# Patient Record
Sex: Male | Born: 1995 | Race: Black or African American | Hispanic: No | Marital: Single | Smoking: Never smoker
Health system: Southern US, Community
[De-identification: ages and names within clinical notes are randomized; demographics above are authoritative.]

## PROBLEM LIST (undated history)

## (undated) DIAGNOSIS — R109 Unspecified abdominal pain: Secondary | ICD-10-CM

## (undated) HISTORY — PX: KNEE SURGERY: SHX244

## (undated) HISTORY — DX: Unspecified abdominal pain: R10.9

---
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
## 2019-05-16 ENCOUNTER — Other Ambulatory Visit: Payer: Self-pay | Admitting: Sports Medicine

## 2019-05-16 DIAGNOSIS — M25552 Pain in left hip: Secondary | ICD-10-CM

## 2019-06-02 ENCOUNTER — Other Ambulatory Visit: Payer: Self-pay

## 2019-06-05 ENCOUNTER — Other Ambulatory Visit: Payer: Self-pay

## 2019-06-05 ENCOUNTER — Ambulatory Visit
Admission: RE | Admit: 2019-06-05 | Discharge: 2019-06-05 | Disposition: A | Discharge status: Home / Self Care | Payer: Managed Care, Other (non HMO) | Source: Ambulatory Visit | Attending: Sports Medicine | Admitting: Sports Medicine

## 2019-06-05 DIAGNOSIS — M25552 Pain in left hip: Secondary | ICD-10-CM

## 2019-09-21 ENCOUNTER — Emergency Department (HOSPITAL_COMMUNITY)
Admission: EM | Admit: 2019-09-21 | Discharge: 2019-09-22 | Disposition: A | Discharge status: Home / Self Care | Payer: Managed Care, Other (non HMO) | Attending: Emergency Medicine | Admitting: Emergency Medicine

## 2019-09-21 ENCOUNTER — Encounter (HOSPITAL_COMMUNITY): Payer: Self-pay | Admitting: Emergency Medicine

## 2019-09-21 ENCOUNTER — Other Ambulatory Visit: Payer: Self-pay

## 2019-09-21 DIAGNOSIS — Z5321 Procedure and treatment not carried out due to patient leaving prior to being seen by health care provider: Secondary | ICD-10-CM | POA: Insufficient documentation

## 2019-09-21 DIAGNOSIS — M25552 Pain in left hip: Secondary | ICD-10-CM | POA: Diagnosis present

## 2019-09-21 NOTE — ED Triage Notes (Signed)
Pt reports he has a left labrum tear that he has had a recent steroid injection for. C/o increased pain in the left hip after playing soccer tonight. Pt ambulatory at this time.

## 2019-09-22 NOTE — ED Notes (Signed)
Pt came to this tech and stated he was leaving and was going to go to urgent care in the morning. Pt was encouraged to stay, pt left.

## 2020-11-14 ENCOUNTER — Emergency Department (HOSPITAL_COMMUNITY)
Admission: EM | Admit: 2020-11-14 | Discharge: 2020-11-15 | Disposition: A | Discharge status: Home / Self Care | Payer: Managed Care, Other (non HMO) | Attending: Emergency Medicine | Admitting: Emergency Medicine

## 2020-11-14 ENCOUNTER — Other Ambulatory Visit: Payer: Self-pay

## 2020-11-14 DIAGNOSIS — L731 Pseudofolliculitis barbae: Secondary | ICD-10-CM | POA: Insufficient documentation

## 2020-11-14 DIAGNOSIS — L738 Other specified follicular disorders: Secondary | ICD-10-CM

## 2020-11-14 NOTE — ED Triage Notes (Signed)
Pt endorses rash to head over left ear that has spread he states since yesterday.  Pt in NAD at time of triage.

## 2020-11-14 NOTE — ED Provider Notes (Signed)
Emergency Medicine Provider Triage Evaluation Note  Bryan Hammond , a 25 y.o. male  was evaluated in triage.  Pt complains of a spreading pruritic rash. First noted on right wrist, then eyelid and then across left hairline behind ear.  Review of Systems  Positive: Rash Negative: Fever, chills  Physical Exam  BP (!) 142/94 (BP Location: Right Arm)   Pulse 74   Temp 98.3 F (36.8 C) (Oral)   Resp 14   Ht 6\' 1"  (1.854 m)   Wt 80.7 kg   SpO2 100%   BMI 23.48 kg/m  Gen:   Awake, no distress   Resp:  Normal effort  MSK:   Moves extremities without difficulty  Other:  Multiple small papules across left hairline  Medical Decision Making  Medically screening exam initiated at 10:40 PM.  Appropriate orders placed.  Jesiel Garate was informed that the remainder of the evaluation will be completed by another provider, this initial triage assessment does not replace that evaluation, and the importance of remaining in the ED until their evaluation is complete.    Lonia Skinner 11/14/20 2240    01/14/21, MD 11/14/20 2250

## 2020-11-15 MED ORDER — DOXYCYCLINE HYCLATE 100 MG PO CAPS: 100.0000 mg | ORAL_CAPSULE | Freq: Two times a day (BID) | ORAL | 0 refills | Status: AC

## 2020-11-15 NOTE — Discharge Instructions (Addendum)
You were seen today for rash.  This is consistent with folliculitis.  Given that it is in your scalp and your beard, you will be placed on oral antibiotics.  Take as directed.  Avoid directly shaving over the area.

## 2020-11-15 NOTE — ED Provider Notes (Signed)
[facility] EMERGENCY DEPARTMENT Provider Note   CSN: [id number] Arrival date & time: 11/14/20  2213     History Chief Complaint  Patient presents with   Rash    Bryan Hammond is a 25 y.o. male.  HPI     This is a 25 year old male who presents with rash.  Patient reports several day history of worsening rash.  It is mostly over his face and his scalp.  Initially it was itchy and now it is becoming more painful.  He has not noted any purulence.  He last had a haircut 3 weeks ago.  He has not been in any hot tubs.  Denies any new soaps, detergents, or shampoos.  He has not had any fevers or systemic symptoms.  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     Family History  Problem Relation Age of Onset   Spina bifida Brother    Ulcers Neg Hx    Cholelithiasis Neg Hx     Social History   Tobacco Use   Smoking status: Never   Smokeless tobacco: Never  Substance Use Topics   Alcohol use: No   Drug use: No    Home Medications Prior to Admission medications   Medication Sig Start Date End Date Taking? Authorizing Provider  doxycycline (VIBRAMYCIN) 100 MG capsule Take 1 capsule (100 mg total) by mouth 2 (two) times daily. 11/15/20  Yes Keani Gotcher, Mayer Masker, MD  cephALEXin (KEFLEX) 500 MG capsule Take 1 capsule (500 mg total) by mouth 2 (two) times daily. 12/24/14   Barrett Henle, PA-C  omeprazole (PRILOSEC) 20 MG capsule Take 1 capsule (20 mg total) by mouth daily. 01/26/13 07/27/13  Jon Gills, MD    Allergies    Lactose intolerance (gi)  Review of Systems   Review of Systems  Constitutional:  Negative for fever.  Skin:  Positive for rash.  All other systems reviewed and are negative.  Physical Exam Updated Vital Signs BP 140/88 (BP  Location: Right Arm)   Pulse 69   Temp 98 F (36.7 C)   Resp 18   Ht 1.854 m (6\' 1" )   Wt 80.7 kg   SpO2 100%   BMI 23.48 kg/m   Physical Exam Vitals and nursing note reviewed.  Constitutional:      Appearance: He is well-developed. He is not ill-appearing.  HENT:     Head: Normocephalic and atraumatic.     Nose: Nose normal.     Mouth/Throat:     Mouth: Mucous membranes are moist.  Eyes:     Pupils: Pupils are equal, round, and reactive to light.  Cardiovascular:     Rate and Rhythm: Normal rate and regular rhythm.  Pulmonary:     Effort: Pulmonary effort is normal. No respiratory distress.  Abdominal:     Palpations: Abdomen is soft.     Tenderness: There is no abdominal tenderness.  Musculoskeletal:     Cervical back: Neck supple.     Right lower leg: No edema.     Left lower leg: No edema.  Lymphadenopathy:     Cervical: No cervical adenopathy.  Skin:    General: Skin is warm and dry.     Comments: Multiple pustules noted over  the left side of patient's face within the hair follicles of his beard, through the hairline above the ear and behind the neck, no fluctuance noted, no adjacent erythema  Neurological:     Mental Status: He is alert and oriented to person, place, and time.  Psychiatric:        Mood and Affect: Mood normal.    ED Results / Procedures / Treatments   Labs (all labs ordered are listed, but only abnormal results are displayed) Labs Reviewed - No data to display  EKG None  Radiology No results found.  Procedures Procedures   Medications Ordered in ED Medications - No data to display  ED Course  I have reviewed the triage vital signs and the nursing notes.  Pertinent labs & imaging results that were available during my care of the patient were reviewed by me and considered in my medical decision making (see chart for details).    MDM Rules/Calculators/A&P                           Patient presents with a rash.  Initially itchy  and now somewhat painful.  He is overall nontoxic and vital signs are reassuring.  He has no systemic symptoms.  He has multiple pustules mostly over the hairline of the face and the scalp.  Presentation most suspicious for folliculitis or folliculitis Barbae.  Less likely contact dermatitis.  He reports haircut 3 weeks ago but does shave as well.  Given location, will treat with oral antibiotics and cover for MRSA with doxycycline.  After history, exam, and medical workup I feel the patient has been appropriately medically screened and is safe for discharge home. Pertinent diagnoses were discussed with the patient. Patient was given return precautions.  Final Clinical Impression(s) / ED Diagnoses Final diagnoses:  Folliculitis barbae    Rx / DC Orders ED Discharge Orders          Ordered    doxycycline (VIBRAMYCIN) 100 MG capsule  2 times daily        11/15/20 0408             Jordanny Waddington, Mayer Masker, MD 11/15/20 [phone]

## 2021-05-11 NOTE — Progress Notes (Deleted)
Synopsis: Referred for dyspnea, CP by self  Subjective:   PATIENT ID: Bryan Hammond GENDER: male DOB: [birth year], MRN: [id number]  No chief complaint on file.     ***  Otherwise pertinent review of systems is negative.  Past Medical History:  Diagnosis Date   Abdominal pain      Family History  Problem Relation Age of Onset   Spina bifida Brother    Ulcers Neg Hx    Cholelithiasis Neg Hx      No past surgical history on file.  Social History   Socioeconomic History   Marital status: Single    Spouse name: Not on file   Number of children: Not on file   Years of education: Not on file   Highest education level: Not on file  Occupational History   Not on file  Tobacco Use   Smoking status: Never   Smokeless tobacco: Never  Substance and Sexual Activity   Alcohol use: No   Drug use: No   Sexual activity: Not on file  Other Topics Concern   Not on file  Social History Narrative   12th grade in Fall 2014   Social Determinants of Health   Financial Resource Strain: Not on file  Food Insecurity: Not on file  Transportation Needs: Not on file  Physical Activity: Not on file  Stress: Not on file  Social Connections: Not on file  Intimate Partner Violence: Not on file     Allergies  Allergen Reactions   Lactose Intolerance (Gi)      Outpatient Medications Prior to Visit  Medication Sig Dispense Refill   cephALEXin (KEFLEX) 500 MG capsule Take 1 capsule (500 mg total) by mouth 2 (two) times daily. 14 capsule 0   doxycycline (VIBRAMYCIN) 100 MG capsule Take 1 capsule (100 mg total) by mouth 2 (two) times daily. 28 capsule 0   omeprazole (PRILOSEC) 20 MG capsule Take 1 capsule (20 mg total) by mouth daily. 30 capsule 6   No facility-administered medications prior to visit.       Objective:   Physical Exam:  General appearance: 26 y.o., male, NAD, conversant  Eyes: anicteric sclerae; PERRL, tracking appropriately HENT: NCAT; MMM Neck: Trachea  midline; no lymphadenopathy, no JVD Lungs: CTAB, no crackles, no wheeze, with normal respiratory effort CV: RRR, no murmur  Abdomen: Soft, non-tender; non-distended, BS present  Extremities: No peripheral edema, warm Skin: Normal turgor and texture; no rash Psych: Appropriate affect Neuro: Alert and oriented to person and place, no focal deficit     There were no vitals filed for this visit.   on *** LPM *** RA BMI Readings from Last 3 Encounters:  11/14/20 23.48 kg/m  12/24/14 20.67 kg/m (21 %, Z= -0.81)*  01/26/13 19.25 kg/m (16 %, Z= -1.00)*   * Growth percentiles are based on CDC (Boys, 2-20 Years) data.   Wt Readings from Last 3 Encounters:  11/14/20 178 lb (80.7 kg)  12/24/14 140 lb (63.5 kg) (26 %, Z= -0.64)*  01/26/13 140 lb (63.5 kg) (39 %, Z= -0.29)*   * Growth percentiles are based on CDC (Boys, 2-20 Years) data.     CBC    Component Value Date/Time   WBC 9.5 12/24/2014 1129   RBC 4.92 12/24/2014 1129   HGB 12.2 (L) 12/24/2014 1129   HCT 38.2 (L) 12/24/2014 1129   PLT 242 12/24/2014 1129   MCV 77.6 (L) 12/24/2014 1129   MCH 24.8 (L) 12/24/2014 1129   MCHC 31.9  12/24/2014 1129   RDW 14.7 12/24/2014 1129    ***  Chest Imaging: ***  Pulmonary Functions Testing Results: No flowsheet data found.  FeNO: ***  Pathology: ***  Echocardiogram: ***  Heart Catheterization: ***    Assessment & Plan:    Plan:      Omar Person, MD [facility] 05/11/2021 4:59 PM

## 2021-05-12 ENCOUNTER — Institutional Professional Consult (permissible substitution): Payer: Self-pay | Admitting: Student

## 2021-05-22 NOTE — Progress Notes (Unsigned)
Synopsis: Referred for shortness of breath, CP by No ref. provider found  Subjective:   PATIENT ID: Bryan Hammond GENDER: male DOB: March 06, 1996, MRN: [id number]  No chief complaint on file.  26yM referred for shortness of breath, CP   Otherwise pertinent review of systems is negative.  Past Medical History:  Diagnosis Date   Abdominal pain      Family History  Problem Relation Age of Onset   Spina bifida Brother    Ulcers Neg Hx    Cholelithiasis Neg Hx      No past surgical history on file.  Social History   Socioeconomic History   Marital status: Single    Spouse name: Not on file   Number of children: Not on file   Years of education: Not on file   Highest education level: Not on file  Occupational History   Not on file  Tobacco Use   Smoking status: Never   Smokeless tobacco: Never  Substance and Sexual Activity   Alcohol use: No   Drug use: No   Sexual activity: Not on file  Other Topics Concern   Not on file  Social History Narrative   12th grade in Fall 2014   Social Determinants of Health   Financial Resource Strain: Not on file  Food Insecurity: Not on file  Transportation Needs: Not on file  Physical Activity: Not on file  Stress: Not on file  Social Connections: Not on file  Intimate Partner Violence: Not on file     Allergies  Allergen Reactions   Lactose Intolerance (Gi)      Outpatient Medications Prior to Visit  Medication Sig Dispense Refill   cephALEXin (KEFLEX) 500 MG capsule Take 1 capsule (500 mg total) by mouth 2 (two) times daily. 14 capsule 0   doxycycline (VIBRAMYCIN) 100 MG capsule Take 1 capsule (100 mg total) by mouth 2 (two) times daily. 28 capsule 0   omeprazole (PRILOSEC) 20 MG capsule Take 1 capsule (20 mg total) by mouth daily. 30 capsule 6   No facility-administered medications prior to visit.       Objective:   Physical Exam:  General appearance: 26 y.o., male, NAD, conversant  Eyes: anicteric  sclerae; PERRL, tracking appropriately HENT: NCAT; MMM Neck: Trachea midline; no lymphadenopathy, no JVD Lungs: CTAB, no crackles, no wheeze, with normal respiratory effort CV: RRR, no murmur  Abdomen: Soft, non-tender; non-distended, BS present  Extremities: No peripheral edema, warm Skin: Normal turgor and texture; no rash Psych: Appropriate affect Neuro: Alert and oriented to person and place, no focal deficit     There were no vitals filed for this visit.   on *** LPM *** RA BMI Readings from Last 3 Encounters:  11/14/20 23.48 kg/m  12/24/14 20.67 kg/m (21 %, Z= -0.81)*  01/26/13 19.25 kg/m (16 %, Z= -1.00)*   * Growth percentiles are based on CDC (Boys, 2-20 Years) data.   Wt Readings from Last 3 Encounters:  11/14/20 178 lb (80.7 kg)  12/24/14 140 lb (63.5 kg) (26 %, Z= -0.64)*  01/26/13 140 lb (63.5 kg) (39 %, Z= -0.29)*   * Growth percentiles are based on CDC (Boys, 2-20 Years) data.     CBC    Component Value Date/Time   WBC 9.5 12/24/2014 1129   RBC 4.92 12/24/2014 1129   HGB 12.2 (L) 12/24/2014 1129   HCT 38.2 (L) 12/24/2014 1129   PLT 242 12/24/2014 1129   MCV 77.6 (L) 12/24/2014 1129  MCH 24.8 (L) 12/24/2014 1129   MCHC 31.9 12/24/2014 1129   RDW 14.7 12/24/2014 1129    ***  Chest Imaging: ***  Pulmonary Functions Testing Results: No flowsheet data found.  FeNO: ***  Pathology: ***  Echocardiogram: ***  Heart Catheterization: ***    Assessment & Plan:    Plan:      Omar Person, MD [facility] 05/22/2021 2:01 PM

## 2021-05-23 ENCOUNTER — Institutional Professional Consult (permissible substitution): Payer: Self-pay | Admitting: Student

## 2021-09-06 ENCOUNTER — Emergency Department (HOSPITAL_COMMUNITY): Payer: Self-pay

## 2021-09-06 ENCOUNTER — Emergency Department (HOSPITAL_COMMUNITY)
Admission: EM | Admit: 2021-09-06 | Discharge: 2021-09-07 | Disposition: A | Discharge status: Home / Self Care | Payer: Self-pay | Attending: Student | Admitting: Student

## 2021-09-06 ENCOUNTER — Other Ambulatory Visit: Payer: Self-pay

## 2021-09-06 DIAGNOSIS — M25561 Pain in right knee: Secondary | ICD-10-CM | POA: Diagnosis not present

## 2021-09-06 MED ORDER — IBUPROFEN 800 MG PO TABS: 800.0000 mg | ORAL_TABLET | Freq: Once | ORAL | Status: DC

## 2021-09-06 NOTE — ED Provider Triage Note (Signed)
  Emergency Medicine Provider Triage Evaluation Note  MRN:  [id number]  Arrival date & time: 09/06/21    Medically screening exam initiated at 11:03 PM.   CC:   Knee pain  HPI:  Bryan Hammond is a 26 y.o. year-old male presents to the ED with chief complaint of right knee pain.  States that it started during a soccer game.  States that he felt 2 pops during the game and now he can't bend it.  States that it felt like it was going to give out.  History provided by History provided by patient. ROS:  -As included in HPI PE:   Vitals:   09/06/21 2209  BP: 128/85  Pulse: 70  Resp: 16  Temp: 98 F (36.7 C)  SpO2: 100%    Non-toxic appearing No respiratory distress Right knee held in extension, won't allow me to flex it, no significant swelling MDM:  Based on signs and symptoms, sprain is highest on my differential, followed by fx. I've ordered imaging in triage to expedite lab/diagnostic workup.  Patient was informed that the remainder of the evaluation will be completed by another provider, this initial triage assessment does not replace that evaluation, and the importance of remaining in the ED until their evaluation is complete.    Roxy Horseman, PA-C 09/06/21 2305

## 2021-09-06 NOTE — ED Triage Notes (Signed)
Pt here for R knee pain. Pt was playing soccer tonight and twisted, heard "2 loud pops", pt has pain when bending the knee. Pt is able to bear weight.

## 2021-09-07 MED ORDER — IBUPROFEN 600 MG PO TABS: 600.0000 mg | ORAL_TABLET | Freq: Four times a day (QID) | ORAL | 0 refills | Status: AC | PRN

## 2021-09-07 NOTE — Progress Notes (Signed)
Orthopedic Tech Progress Note Patient Details:  Bryan Hammond 08/09/1995 [id number]  Ortho Devices Type of Ortho Device: Knee Immobilizer, Crutches Ortho Device/Splint Location: RLE Ortho Device/Splint Interventions: Ordered, Application, Adjustment   Post Interventions Patient Tolerated: Well Instructions Provided: Adjustment of device, Care of device  [place] A [name] 09/07/2021, 3:46 AM

## 2021-09-07 NOTE — ED Provider Notes (Signed)
[facility]EMERGENCY DEPT [facility] Emergency Department Provider Note MRN:  [id number]  Arrival date & time: 09/07/21     Chief Complaint   Knee Pain   History of Present Illness   Bryan Hammond is a 26 y.o. year-old male presents to the ED with chief complaint of right knee pain.  States that it started during a soccer game.  States that he felt 2 pops during the game and now he can't bend it.  States that it felt like it was going to give out.Marland Kitchen  History provided by patient.   Review of Systems  Pertinent review of systems noted in HPI.    Physical Exam   Vitals:   09/06/21 2209 09/07/21 0313  BP: 128/85 (!) 145/81  Pulse: 70 65  Resp: 16 18  Temp: 98 F (36.7 C) 98.2 F (36.8 C)  SpO2: 100% 100%    CONSTITUTIONAL:  well-appearing, NAD NEURO:  Alert and oriented x 3, CN 3-12 grossly intact EYES:  eyes equal and reactive ENT/NECK:  Supple, no stridor  CARDIO:   appears well-perfused  PULM:  No respiratory distress,  GI/GU:  non-distended,  MSK/SPINE:  No gross deformities, no significant swelling, pain with flexion SKIN:  no rash, atraumatic   *Additional and/or pertinent findings included in MDM below  Diagnostic and Interventional Summary    EKG Interpretation  Date/Time:    Ventricular Rate:    PR Interval:    QRS Duration:   QT Interval:    QTC Calculation:   R Axis:     Text Interpretation:         Labs Reviewed - No data to display  DG Knee Complete 4 Views Right  Final Result      Medications  ibuprofen (ADVIL) tablet 800 mg (has no administration in time range)     Procedures  /  Critical Care Procedures  ED Course and Medical Decision Making  I have reviewed the triage vital signs, the nursing notes, and pertinent available records from the EMR.  Social Determinants Affecting Complexity of Care: Patient has no clinically significant social determinants affecting this chief complaint..   ED Course:   Patient here with right  knee pain.  Top differential diagnoses include fx, sprain, streain. Medical Decision Making Problems Addressed: Acute pain of right knee: acute illness or injury  Amount and/or Complexity of Data Reviewed Radiology: ordered and independent interpretation performed.    Details: no fx or dislocation  Risk Prescription drug management.     Consultants: No consultations were needed in caring for this patient.   Treatment and Plan: Emergency department workup does not suggest an emergent condition requiring admission or immediate intervention beyond  what has been performed at this time. The patient is safe for discharge and has  been instructed to return immediately for worsening symptoms, change in  symptoms or any other concerns    Final Clinical Impressions(s) / ED Diagnoses     ICD-10-CM   1. Acute pain of right knee  M25.561       ED Discharge Orders          Ordered    ibuprofen (ADVIL) 600 MG tablet  Every 6 hours PRN        09/07/21 0326              Discharge Instructions Discussed with and Provided to Patient:   Discharge Instructions   None      Roxy Horseman, PA-C 09/07/21 1856    [place],  Wyn Forster, MD 09/07/21 [phone]

## 2022-01-01 ENCOUNTER — Encounter: Payer: PRIVATE HEALTH INSURANCE | Attending: Specialist

## 2022-01-01 NOTE — Progress Notes (Deleted)
ASSESSMENT/PLAN:  Below is the assessment and plan developed based on review of pertinent history, physical exam, labs, studies, and medications.    {There are no diagnoses linked to this encounter. (Refresh or delete this SmartLink)}    No follow-ups on file.     In discussion with the patient, we considered the numerus possible diagnoses that could be contributing to their present symptoms. We also deliberated on the extensive management options that must be considered to treat their current condition. We reviewed their accessible prior medical records, diagnostic tests, and current health and employment information. We considered how these symptoms were affecting the patients activities of daily living as well as employment and fitness activities. The patient had various questions regarding the possible risks, benefits, complications, morbidity and mortality regarding their diagnosis and treatment options. The patients comorbidities were considered, and I advocated that they consider maximizing lifestyle modification through nutrition and exercise to aid in addressing their symptoms. Shared decision making yielded an understanding to move forward with conservation treatment preferences. The patient expressed understanding that if conservative management fails to alleviate the present symptoms they will return to office for re-evaluation and consideration of additional diagnostic tests and potential surgical options.     In the interim, we have recommended ice, elevation, and take prescription anti-inflammatory medications along with a physician directed home exercise program. We discussed the risks and common side effects of anti-inflammatory medications and instructed the patient to discontinue the medication and contact us if they experienced any side effects. The patient was encouraged to discuss the possible side effects with their family physician or pharmacist prior to initiating any new  medications.    We discussed the fact that many of the recommended treatment options presented are significantly limited by the patients social determinants of health. We also reviewed the circumstances surrounding the environment that they live and work which affect a wide range of health risk. We considered the limited access to appropriate educational resources regarding proper nutrition and exercise as well as the economic and social support necessary to maintain health and wellbeing.     ***    SUBJECTIVE/OBJECTIVE:  Dominic Arnold (DOB: 04/06/1996) is a 26 y.o. male, patient,here for evaluation of the No chief complaint on file.  .   ***     PHYSICAL EXAM:    ***    IMAGING:    I have independently reviewed and interpreted the following images:     ***    Not on File    No current outpatient medications on file.     No current facility-administered medications for this visit.       No past medical history on file.    No past surgical history on file.    No family history on file.    Social History     Socioeconomic History    Marital status: Married     Spouse name: Not on file    Number of children: Not on file    Years of education: Not on file    Highest education level: Not on file   Occupational History    Not on file   Tobacco Use    Smoking status: Not on file    Smokeless tobacco: Not on file   Substance and Sexual Activity    Alcohol use: Not on file    Drug use: Not on file    Sexual activity: Not on file   Other Topics Concern    Not on  file   Social History Narrative    Not on file     Social Determinants of Health     Financial Resource Strain: Not on file   Food Insecurity: Not on file   Transportation Needs: Not on file   Physical Activity: Not on file   Stress: Not on file   Social Connections: Not on file   Intimate Partner Violence: Not on file   Housing Stability: Not on file       Review of Systems    No flowsheet data found.    Vitals:  There were no vitals taken for this visit.   There is  no height or weight on file to calculate BMI.      An electronic signature was used to authenticate this note.  -- Cyndia Skeeters, MD

## 2022-01-03 ENCOUNTER — Ambulatory Visit: Admit: 2022-01-03 | Discharge: 2022-01-03 | Payer: PRIVATE HEALTH INSURANCE | Attending: Specialist

## 2022-01-03 ENCOUNTER — Ambulatory Visit: Admit: 2022-01-03 | Payer: PRIVATE HEALTH INSURANCE

## 2022-01-03 DIAGNOSIS — M25561 Pain in right knee: Secondary | ICD-10-CM

## 2022-01-03 NOTE — Progress Notes (Unsigned)
ASSESSMENT/PLAN:  Below is the assessment and plan developed based on review of pertinent history, physical exam, labs, studies, and medications.    {There are no diagnoses linked to this encounter. (Refresh or delete this SmartLink)}    No follow-ups on file.     In discussion with the patient, we considered the numerus possible diagnoses that could be contributing to their present symptoms. We also deliberated on the extensive management options that must be considered to treat their current condition. We reviewed their accessible prior medical records, diagnostic tests, and current health and employment information. We considered how these symptoms were affecting the patients activities of daily living as well as employment and fitness activities. The patient had various questions regarding the possible risks, benefits, complications, morbidity and mortality regarding their diagnosis and treatment options. The patients comorbidities were considered, and I advocated that they consider maximizing lifestyle modification through nutrition and exercise to aid in addressing their symptoms. Shared decision making yielded an understanding to move forward with conservation treatment preferences. The patient expressed understanding that if conservative management fails to alleviate the present symptoms they will return to office for re-evaluation and consideration of additional diagnostic tests and potential surgical options.     In the interim, we have recommended ice, elevation, and take prescription anti-inflammatory medications along with a physician directed home exercise program. We discussed the risks and common side effects of anti-inflammatory medications and instructed the patient to discontinue the medication and contact us if they experienced any side effects. The patient was encouraged to discuss the possible side effects with their family physician or pharmacist prior to initiating any new  medications.    We discussed the fact that many of the recommended treatment options presented are significantly limited by the patients social determinants of health. We also reviewed the circumstances surrounding the environment that they live and work which affect a wide range of health risk. We considered the limited access to appropriate educational resources regarding proper nutrition and exercise as well as the economic and social support necessary to maintain health and wellbeing.     ***    SUBJECTIVE/OBJECTIVE:  Dominic Arnold (DOB: 04/06/1996) is a 26 y.o. male, patient,here for evaluation of the No chief complaint on file.  .   ***     PHYSICAL EXAM:    ***    IMAGING:    I have independently reviewed and interpreted the following images:     ***    Not on File    No current outpatient medications on file.     No current facility-administered medications for this visit.       No past medical history on file.    No past surgical history on file.    No family history on file.    Social History     Socioeconomic History   . Marital status: Married     Spouse name: Not on file   . Number of children: Not on file   . Years of education: Not on file   . Highest education level: Not on file   Occupational History   . Not on file   Tobacco Use   . Smoking status: Not on file   . Smokeless tobacco: Not on file   Substance and Sexual Activity   . Alcohol use: Not on file   . Drug use: Not on file   . Sexual activity: Not on file   Other Topics Concern   . Not on  file   Social History Narrative   . Not on file     Social Determinants of Health     Financial Resource Strain: Not on file   Food Insecurity: Not on file   Transportation Needs: Not on file   Physical Activity: Not on file   Stress: Not on file   Social Connections: Not on file   Intimate Partner Violence: Not on file   Housing Stability: Not on file       Review of Systems    No flowsheet data found.    Vitals:  There were no vitals taken for this visit.    There is no height or weight on file to calculate BMI.      An electronic signature was used to authenticate this note.  -- Illene Bolus, MD

## 2022-01-31 NOTE — Telephone Encounter (Signed)
Patient phones office leaving a message in reference to him retrieving his previous MRI  for his knee. I phoned patient back and had to leave a message.

## 2022-02-21 ENCOUNTER — Ambulatory Visit: Admit: 2022-02-21 | Discharge: 2022-02-21 | Payer: PRIVATE HEALTH INSURANCE | Attending: Specialist

## 2022-02-21 DIAGNOSIS — S83281S Other tear of lateral meniscus, current injury, right knee, sequela: Secondary | ICD-10-CM

## 2022-02-21 NOTE — Progress Notes (Signed)
ASSESSMENT/PLAN:  Below is the assessment and plan developed based on review of pertinent history, physical exam, labs, studies, and medications.    1. Tear of lateral meniscus of right knee, unspecified tear type, unspecified whether old or current tear, sequela      Return for Patient will call to schedule surgery.     We discussed the treatment options for the patients diagnosis, which included: living with the extremity as it is, organized exercises, medicines, injections, and surgical options. Utilizing shared treatment decision-making; we also discussed the nature and purpose of the treatment options along with the expected risks and benefits. The patient has expressed a desire to proceed with surgery, and I think that is a reasonable option. I educated the patient regarding the inherent and unavoidable risks which include, but are not limited to anesthesia, infection, damage to nerves and blood vessels, blood loss, blood clots, and even death were discussed at length. We also talked about the possibility of not being able to return to prior activities or employment, the need for future surgery, and complex regional pain syndrome. The patient expressed understanding of these risks and has elected to proceed with surgery. Ample time was given for questions, of which many were addressed. We have discussed the surgical procedure as well as the realistic expectations regarding the risks, outcome and post-operative protocol. We will set this up when it is convenient for the patient. We have instructed them to contact us if there are any questions or concerns between now and their date of surgery.     We talked about the procedure as well as the postoperative protocol in detail.  He is can look at his counter and call us to schedule right knee arthroscopy with possible lateral meniscus repair versus meniscectomy.  He understands the difference in repair and meniscectomy regarding the postop protocol.  He  understands he would be more than likely on crutches for 6 weeks.  He will bring this the MRI disc for viewing prior to his date of surgery.    SUBJECTIVE/OBJECTIVE:  Dominic Arnold (DOB: 02/04/1996) is a 26 y.o. male, patient,here for evaluation of the Knee Pain (right)  .   Patient seen today for the right knee.  Unfortunately, he sustained a soccer injury a couple months ago and injured his lateral meniscus.  He had an MRI and was diagnosed with a radial tear of his lateral meniscus.  He was subsequently treated conservatively.  He follows up today.  He states that he has been doing well and is still playing soccer.  He complains that he is feeling locking and catching on the lateral aspect of his knee.  He states that he can still play soccer but his knee swells up afterwards.    PHYSICAL EXAM:    Right knee with portal sites well-healed.  He has full active and passive range of motion of the knee.  He has no tenderness palpation of the lateral or medial joint line.  He has a negative Murray's.  Negative Lachman's.  Stable to varus and valgus stress at 0 and 30 degrees.    IMAGING:    I have independently reviewed and interpreted the following images:     4 views of the right knee show no evidence of fracture or dislocation.  There is good joint space and alignment maintained.    Allergies   Allergen Reactions    Lactose Intolerance (Gi)        Current Outpatient Medications   Medication  Sig Dispense Refill    ondansetron (ZOFRAN-ODT) 4 MG disintegrating tablet Place 1 tablet every 6-8 hours by translingual route as needed.       No current facility-administered medications for this visit.       Past Medical History:   Diagnosis Date    Tear of MCL (medial collateral ligament) of knee        Past Surgical History:   Procedure Laterality Date    KNEE ARTHROSCOPY W/ MEDIAL COLLATERAL LIGAMENT (MCL) REPAIR         No family history on file.    Social History     Socioeconomic History    Marital status: Married      Spouse name: Not on file    Number of children: Not on file    Years of education: Not on file    Highest education level: Not on file   Occupational History    Not on file   Tobacco Use    Smoking status: Never    Smokeless tobacco: Never   Substance and Sexual Activity    Alcohol use: Never    Drug use: Never    Sexual activity: Not on file   Other Topics Concern    Not on file   Social History Narrative    Not on file     Social Determinants of Health     Financial Resource Strain: Not on file   Food Insecurity: Not on file   Transportation Needs: Not on file   Physical Activity: Not on file   Stress: Not on file   Social Connections: Not on file   Intimate Partner Violence: Not on file   Housing Stability: Not on file       Review of Systems    No flowsheet data found.    Vitals:  Ht 1.854 m (6\' 1" )   Wt 80.7 kg (178 lb)   BMI 23.48 kg/m    Body mass index is 23.48 kg/m.      An electronic signature was used to authenticate this note.  -- Cyndia Skeeters, MD

## 2022-03-13 ENCOUNTER — Encounter: Payer: PRIVATE HEALTH INSURANCE | Attending: Specialist

## 2022-03-13 DIAGNOSIS — S83281S Other tear of lateral meniscus, current injury, right knee, sequela: Secondary | ICD-10-CM

## 2022-03-13 NOTE — Progress Notes (Unsigned)
ASSESSMENT/PLAN:  Below is the assessment and plan developed based on review of pertinent history, physical exam, labs, studies, and medications.    1. Tear of lateral meniscus of right knee, unspecified tear type, unspecified whether old or current tear, sequela      No follow-ups on file.     We discussed the treatment options for the patients diagnosis, which included: living with the extremity as it is, organized exercises, medicines, injections, and surgical options. Utilizing shared treatment decision-making; we also discussed the nature and purpose of the treatment options along with the expected risks and benefits. The patient has expressed a desire to proceed with surgery, and I think that is a reasonable option. I educated the patient regarding the inherent and unavoidable risks which include, but are not limited to anesthesia, infection, damage to nerves and blood vessels, blood loss, blood clots, and even death were discussed at length. We also talked about the possibility of not being able to return to prior activities or employment, the need for future surgery, and complex regional pain syndrome. The patient expressed understanding of these risks and has elected to proceed with surgery. Ample time was given for questions, of which many were addressed. We have discussed the surgical procedure as well as the realistic expectations regarding the risks, outcome and post-operative protocol. We will set this up when it is convenient for the patient. We have instructed them to contact us if there are any questions or concerns between now and their date of surgery.     We talked about the procedure as well as the postoperative protocol in detail.  He is can look at his counter and call us to schedule right knee arthroscopy with possible lateral meniscus repair versus meniscectomy.  He understands the difference in repair and meniscectomy regarding the postop protocol.  He understands he would be more than  likely on crutches for 6 weeks.  He will bring this the MRI disc for viewing prior to his date of surgery.    SUBJECTIVE/OBJECTIVE:  Dominic Arnold (DOB: 01/23/1996) is a 26 y.o. male, patient,here for evaluation of the No chief complaint on file.  .   Patient seen today for the right knee.  Unfortunately, he sustained a soccer injury a couple months ago and injured his lateral meniscus.  He had an MRI and was diagnosed with a radial tear of his lateral meniscus.  He was subsequently treated conservatively.  He follows up today.  He states that he has been doing well and is still playing soccer.  He complains that he is feeling locking and catching on the lateral aspect of his knee.  He states that he can still play soccer but his knee swells up afterwards.    PHYSICAL EXAM:    Right knee with portal sites well-healed.  He has full active and passive range of motion of the knee.  He has no tenderness palpation of the lateral or medial joint line.  He has a negative Murray's.  Negative Lachman's.  Stable to varus and valgus stress at 0 and 30 degrees.    IMAGING:    I have independently reviewed and interpreted the following images:     4 views of the right knee show no evidence of fracture or dislocation.  There is good joint space and alignment maintained.    Allergies   Allergen Reactions   . Lactose Intolerance (Gi)        Current Outpatient Medications   Medication Sig Dispense  Refill   . ondansetron (ZOFRAN-ODT) 4 MG disintegrating tablet Place 1 tablet every 6-8 hours by translingual route as needed.       No current facility-administered medications for this visit.       Past Medical History:   Diagnosis Date   . Tear of MCL (medial collateral ligament) of knee        Past Surgical History:   Procedure Laterality Date   . KNEE ARTHROSCOPY W/ MEDIAL COLLATERAL LIGAMENT (MCL) REPAIR         No family history on file.    Social History     Socioeconomic History   . Marital status: Married     Spouse name: Not on  file   . Number of children: Not on file   . Years of education: Not on file   . Highest education level: Not on file   Occupational History   . Not on file   Tobacco Use   . Smoking status: Never   . Smokeless tobacco: Never   Substance and Sexual Activity   . Alcohol use: Never   . Drug use: Never   . Sexual activity: Not on file   Other Topics Concern   . Not on file   Social History Narrative   . Not on file     Social Determinants of Health     Financial Resource Strain: Not on file   Food Insecurity: Not on file   Transportation Needs: Not on file   Physical Activity: Not on file   Stress: Not on file   Social Connections: Not on file   Intimate Partner Violence: Not on file   Housing Stability: Not on file       Review of Systems    No flowsheet data found.    Vitals:  There were no vitals taken for this visit.   There is no height or weight on file to calculate BMI.      An electronic signature was used to authenticate this note.  -- Illene Bolus, MD

## 2022-07-28 ENCOUNTER — Other Ambulatory Visit: Payer: Self-pay

## 2022-07-28 ENCOUNTER — Emergency Department (HOSPITAL_BASED_OUTPATIENT_CLINIC_OR_DEPARTMENT_OTHER): Payer: PRIVATE HEALTH INSURANCE | Admitting: Radiology

## 2022-07-28 ENCOUNTER — Encounter (HOSPITAL_BASED_OUTPATIENT_CLINIC_OR_DEPARTMENT_OTHER): Payer: Self-pay | Admitting: Emergency Medicine

## 2022-07-28 ENCOUNTER — Emergency Department (HOSPITAL_BASED_OUTPATIENT_CLINIC_OR_DEPARTMENT_OTHER)
Admission: EM | Admit: 2022-07-28 | Discharge: 2022-07-28 | Disposition: A | Discharge status: Home / Self Care | Payer: PRIVATE HEALTH INSURANCE | Attending: Emergency Medicine | Admitting: Emergency Medicine

## 2022-07-28 ENCOUNTER — Other Ambulatory Visit (HOSPITAL_BASED_OUTPATIENT_CLINIC_OR_DEPARTMENT_OTHER): Payer: Self-pay

## 2022-07-28 DIAGNOSIS — S82255A Nondisplaced comminuted fracture of shaft of left tibia, initial encounter for closed fracture: Secondary | ICD-10-CM | POA: Diagnosis not present

## 2022-07-28 DIAGNOSIS — Y9366 Activity, soccer: Secondary | ICD-10-CM | POA: Insufficient documentation

## 2022-07-28 DIAGNOSIS — M79604 Pain in right leg: Secondary | ICD-10-CM | POA: Diagnosis present

## 2022-07-28 DIAGNOSIS — S82254A Nondisplaced comminuted fracture of shaft of right tibia, initial encounter for closed fracture: Secondary | ICD-10-CM

## 2022-07-28 DIAGNOSIS — W2102XA Struck by soccer ball, initial encounter: Secondary | ICD-10-CM | POA: Insufficient documentation

## 2022-07-28 MED ORDER — HYDROCODONE-ACETAMINOPHEN 5-325 MG PO TABS
1.0000 | ORAL_TABLET | Freq: Four times a day (QID) | ORAL | 0 refills | Status: AC | PRN
  Filled 2022-07-28: qty 20, 5d supply, fill #0

## 2022-07-28 NOTE — ED Notes (Signed)
Pt resting with family at beside. No needs at this time, call light in reach.

## 2022-07-28 NOTE — Hospital Course (Signed)
Chief Complaint: Right tibia fracture  History: It is a very pleasant 27 year old gentleman who was playing soccer and was struck in the right tibia.  He noted immediate discomfort and inability to weight-bear.  Imaging at the [facility] urgent emergency room demonstrated a nondisplaced tibia fracture.  Orthopedic consultation was requested.  Review of systems: No headaches, vision disturbances, loss of consciousness.  No nausea or vomiting.  Past Medical History:  Diagnosis Date   Abdominal pain     Allergies  Allergen Reactions   Lactose Intolerance (Gi)     No current facility-administered medications on file prior to encounter.   Current Outpatient Medications on File Prior to Encounter  Medication Sig Dispense Refill   cephALEXin (KEFLEX) 500 MG capsule Take 1 capsule (500 mg total) by mouth 2 (two) times daily. 14 capsule 0   doxycycline (VIBRAMYCIN) 100 MG capsule Take 1 capsule (100 mg total) by mouth 2 (two) times daily. 28 capsule 0   ibuprofen (ADVIL) 600 MG tablet Take 1 tablet (600 mg total) by mouth every 6 (six) hours as needed. 30 tablet 0   omeprazole (PRILOSEC) 20 MG capsule Take 1 capsule (20 mg total) by mouth daily. 30 capsule 6    Physical Exam: Vitals:   07/28/22 1223 07/28/22 1224  BP:  (!) 142/96  Pulse:  86  Resp:  20  Temp:  98.4 F (36.9 C)  SpO2: 97% 98%   Body mass index is 25.46 kg/m. He is alert and oriented x 3. No shortness of breath or chest pain. Abdomen soft and nontender.  No rebound tenderness. Full range of motion in the upper extremity bilaterally. Tenderness over the right midshaft tibia.  Superficial abrasion is noted.  Compartments are soft and nontender.  2+ dorsalis pedis/posterior tibialis pulses bilaterally.  EHL/tibialis anterior/gastrocnemius are intact with 5 out of 5 motor strength and normal sensation light touch throughout in the lower extremity. No swelling or tenderness of the knee or hip or  ankle.  Image: DG Tibia/Fibula Right  Result Date: 07/28/2022 CLINICAL DATA:  Right leg injury while playing soccer. Right leg pain and inability to bear weight. EXAM: RIGHT TIBIA AND FIBULA - 2 VIEW COMPARISON:  None Available. FINDINGS: A comminuted nondisplaced fracture is seen involving the mid to distal tibial diaphysis. No other fractures or bone lesions identified. IMPRESSION: Comminuted, nondisplaced tibial shaft fracture. Electronically Signed   By: Danae Orleans M.D.   On: 07/28/2022 12:56    A/P:  Bryan Hammond is a very pleasant 27 year old otherwise healthy individual who was playing soccer earlier today and was struck in the right anterior shin.  He noted immediate pain and inability to weight-bear on the right lower extremity.  There was no gross deformity, laceration, or exposed bone.  Patient was brought to the urgent care where imaging studies were done.  Patient has a nondisplaced midshaft fracture of the tibia..  As result Ortho consultation was requested.   Patient was evaluated in the urgent care setting and was noted to be neurovascularly intact.  Compartments are soft and non-tender, with a small abrasion over the midshaft of the tibia.  Patient had no other complaints.  There is no swelling or tenderness over the hip, knee, or ankle.  Imaging confirms a nondisplaced tibial shaft fracture.  Discussed treatment options with the patient.  He indicated that he is from [place] and was planning to return there today.  I have stressed to him the need to maintain the leg in  the elevated position to prevent swelling and the importance of being seen within the next 5 to 7 days.  I have counseled him on the signs and symptoms of a compartment syndrome with progressive debilitating pain, numbness and dysesthesias, and a cool foot.  All of their questions were addressed.  A long-leg splint with the knee in flexion will be applied by the Ortho tech and the patient will will clear crutch  ambulation nonweightbearing on the right lower extremity.  Medications will be provided by the emergency department physician.

## 2022-07-28 NOTE — H&P (Signed)
History:  Bryan Hammond is a very pleasant 27 year old gentleman who was in his usual state of good to excellent health until he was struck in the right tibia while playing soccer.  He noted immediate pain and inability to weight-bear.  He was seen in the emergency department here at [facility] and noted to have a nondisplaced tibia fracture.  As result Ortho consultation was requested.  Patient denied any loss of consciousness, visual deficits, or headaches.  Past Medical History:  Diagnosis Date   Abdominal pain     Allergies  Allergen Reactions   Lactose Intolerance (Gi)     No current facility-administered medications on file prior to encounter.   Current Outpatient Medications on File Prior to Encounter  Medication Sig Dispense Refill   cephALEXin (KEFLEX) 500 MG capsule Take 1 capsule (500 mg total) by mouth 2 (two) times daily. 14 capsule 0   doxycycline (VIBRAMYCIN) 100 MG capsule Take 1 capsule (100 mg total) by mouth 2 (two) times daily. 28 capsule 0   ibuprofen (ADVIL) 600 MG tablet Take 1 tablet (600 mg total) by mouth every 6 (six) hours as needed. 30 tablet 0   omeprazole (PRILOSEC) 20 MG capsule Take 1 capsule (20 mg total) by mouth daily. 30 capsule 6    Physical Exam: Vitals:   07/28/22 1223 07/28/22 1224  BP:  (!) 142/96  Pulse:  86  Resp:  20  Temp:  98.4 F (36.9 C)  SpO2: 97% 98%   Body mass index is 25.46 kg/m. Alert and oriented x 3 No shortness of breath or chest pain Abdomen soft and nontender.  No loss of bowel or bladder control, no rebound tenderness Right lower extremity: There is a slight abrasion over the right tibia.  Compartments soft and nontender, 2+ dorsalis pedis/posterior tibialis pulses bilaterally.  5/5 EHL/tibialis anterior strength bilaterally.  Sensation light touch is intact throughout.  No knee or ankle swelling/pain with palpation.    Image: DG Tibia/Fibula Right  Result Date: 07/28/2022 CLINICAL DATA:  Right leg injury while  playing soccer. Right leg pain and inability to bear weight. EXAM: RIGHT TIBIA AND FIBULA - 2 VIEW COMPARISON:  None Available. FINDINGS: A comminuted nondisplaced fracture is seen involving the mid to distal tibial diaphysis. No other fractures or bone lesions identified. IMPRESSION: Comminuted, nondisplaced tibial shaft fracture. Electronically Signed   By: Danae Orleans M.D.   On: 07/28/2022 12:56    A/P: Bryan Hammond is a very pleasant 27 year old gentleman who was in his usual state of good to excellent health until he was struck in the right tibia while playing soccer.  He noted immediate pain and inability to weight-bear.  He was seen in the emergency department here at [facility] and noted to have a nondisplaced tibia fracture.  As result Ortho consultation was requested.  Patient denied any loss of consciousness, visual deficits, or headaches.  Medical exam demonstrates a nondisplaced tibia fracture with no signs or symptoms of compartment syndrome.  I discussed the signs of a compartment syndrome with the patient and his parents who were present.  He plans on being transported back to his home in .  I stressed the need for him to be seen in the next 7 days for further evaluation and ongoing care of his fracture.  A splint will be applied and he will be nonweightbearing with crutches.  Since he is transporting back to [place] I recommended a long-leg splint with the knee in gentle flexion.  Pain medication will be provided by the emergency department.

## 2022-07-28 NOTE — Discharge Instructions (Addendum)
Please follow-up with the orthopedics specialist in her hometown in the next few days.  Today spoke with the orthopedic specialist who emphasized return precautions including rigid compartments, worsening pain, change in sensation/motor skills.  The orthopedic specialist recommended putting you in a splint and crutches.  For you to help the mobile until you can see orthopedic specialist in your hometown.  You may take ibuprofen every 6 hours needed for pain and I prescribed for you Norco to take for pain that is not controlled with ibuprofen.  If symptoms worsen please return to ER.

## 2022-07-28 NOTE — ED Triage Notes (Signed)
Pt via pov from home wit injury to right shin. It was injured while playing soccer. Obvious swelling to shin, and pt states he is unable to bear weight; states his ankle and toes are tingling. Cap refill <2 seconds. Pt alert & oriented, nad noted.

## 2022-07-28 NOTE — ED Provider Notes (Signed)
[facility] EMERGENCY DEPARTMENT AT [facility] Provider Note   CSN: [id number] Arrival date & time: 07/28/22  1204     History  Chief Complaint  Patient presents with   Leg Injury    Bryan Hammond is a 27 y.o. male who presented with right leg pain after being kicked in the leg while playing soccer.  Patient states his right leg hurts and that there is swelling on the distal half.  Patient is still able to move his foot but does endorse paresthesias and is unable to bear weight due to pain and feeling unstable.  Patient denied any skin color changes or open wounds.    Home Medications Prior to Admission medications   Medication Sig Start Date End Date Taking? Authorizing Provider  HYDROcodone-acetaminophen (NORCO) 5-325 MG tablet Take 1 tablet by mouth every 6 (six) hours as needed for up to 5 days for moderate pain. 07/28/22 08/02/22 Yes Erilyn Pearman, Beverly Gust, PA-C  cephALEXin (KEFLEX) 500 MG capsule Take 1 capsule (500 mg total) by mouth 2 (two) times daily. 12/24/14   Barrett Henle, PA-C  doxycycline (VIBRAMYCIN) 100 MG capsule Take 1 capsule (100 mg total) by mouth 2 (two) times daily. 11/15/20   Horton, Mayer Masker, MD  ibuprofen (ADVIL) 600 MG tablet Take 1 tablet (600 mg total) by mouth every 6 (six) hours as needed. 09/07/21   Roxy Horseman, PA-C  omeprazole (PRILOSEC) 20 MG capsule Take 1 capsule (20 mg total) by mouth daily. 01/26/13 07/27/13  Jon Gills, MD      Allergies    Lactose intolerance (gi)    Review of Systems   Review of Systems See HPI Physical Exam Updated Vital Signs BP (!) 142/96 (BP Location: Right Arm)   Pulse 86   Temp 98.4 F (36.9 C) (Oral)   Resp 20   Ht  (1.854 m)   Wt 87.5 kg   SpO2 98%   BMI 25.46 kg/m  Physical Exam Constitutional:      General: He is not in acute distress. Cardiovascular:     Comments: 2+ bilateral dorsalis pedis/posterior tibialis pulses with regular rate Musculoskeletal:        General:  Swelling, tenderness and deformity present.     Comments: Right lower leg: Obvious deformity with edema to mid distal tibia, tenderness to palpation with step-offs appreciated, able to range right ankle/toes  Skin:    Capillary Refill: Capillary refill takes 2 to 3 seconds.     Coloration: Skin is not pale.     Findings: No erythema.     Comments: No signs of open fracture  Neurological:     Mental Status: He is alert.     Comments: Paresthesias to right foot however still able to feel palpation     ED Results / Procedures / Treatments   Labs (all labs ordered are listed, but only abnormal results are displayed) Labs Reviewed - No data to display  EKG None  Radiology DG Tibia/Fibula Right  Result Date: 07/28/2022 CLINICAL DATA:  Right leg injury while playing soccer. Right leg pain and inability to bear weight. EXAM: RIGHT TIBIA AND FIBULA - 2 VIEW COMPARISON:  None Available. FINDINGS: A comminuted nondisplaced fracture is seen involving the mid to distal tibial diaphysis. No other fractures or bone lesions identified. IMPRESSION: Comminuted, nondisplaced tibial shaft fracture. Electronically Signed   By: Danae Orleans M.D.   On: 07/28/2022 12:56    Procedures Procedures    Medications Ordered in ED  Medications - No data to display  ED Course/ Medical Decision Making/ A&P                             Medical Decision Making Amount and/or Complexity of Data Reviewed Radiology: ordered.   Lonia Skinner 27 y.o. presented today for right leg pain. Working DDx that I considered at this time includes, but not limited to, tibia fracture, Maisonneuve fracture, neurovascular compromise, ischemic limb,, ankle sprain, knee sprain.  R/o DDx: Maisonneuve fracture, neurovascular compromise, ischemic limb,, ankle sprain, knee sprain: These are considered less likely due to history of present illness and physical exam findings  Review of prior external notes: None  Unique Tests and My  Interpretation:  Right tib-fib x-ray: Nondisplaced comminuted mid tibial fracture  Discussion with Independent Historian:  Parents  Discussion of Management of Tests:  Shon Baton, MD [facility]  Risk: Medium: prescription drug management  Risk Stratification Score: None  Staffed with Rancour, MD  Plan: Patient presented for right leg pain. On exam patient was in no acute distress and stable vitals.  On exam patient had cap refill of 2 to 3 seconds and endorsed paresthesias in his foot however he was able to feel when I palpated and had full active range of motion in his feet and had good pulses.  Patient had obvious deformity to his right leg with step-off noted however no signs of open fracture.  Orthopedics was consulted and said they would come by to evaluate the patient and the patient will most likely need a long-leg splint with crutches and be seen in the outpatient setting.  Patient stable at this time and not requiring pain meds.  Orthopedics came and evaluated the patient.  Orthopedics spoke about return precautions including compartment syndrome, worsening symptoms.  Orthopedics also spoke to the patient about when the patient needs to follow-up and instructed that patient receives long-leg splint with stirrup held in slight flexion at the knee and be given crutches.  Patient will be given Norco for pain not covered by ibuprofen and was educated that he may take ibuprofen every 6 hours needed for pain.  Patient was given return precautions. Patient stable for discharge at this time.  Patient verbalized understanding of plan.         Final Clinical Impression(s) / ED Diagnoses Final diagnoses:  Closed nondisplaced comminuted fracture of shaft of left tibia, initial encounter    Rx / DC Orders ED Discharge Orders          Ordered    HYDROcodone-acetaminophen (NORCO) 5-325 MG tablet  Every 6 hours PRN        07/28/22 1413              Remi Deter 07/28/22 1454    Glynn Octave, MD 07/28/22 1646

## 2022-07-28 NOTE — H&P (Signed)
Formatting of this note is different from the original.      History:  Dominic Arnold is a very pleasant 27 year old gentleman who was in his usual state of good to excellent health until he was struck in the right tibia while playing soccer.  He noted immediate pain and inability to weight-bear.  He was seen in the emergency department here at [facility] and noted to have a nondisplaced tibia fracture.  As result Ortho consultation was requested.  Patient denied any loss of consciousness, visual deficits, or headaches.    Past Medical History:   Diagnosis Date    Abdominal pain      Allergies   Allergen Reactions    Lactose Intolerance (Gi)      No current facility-administered medications on file prior to encounter.     Current Outpatient Medications on File Prior to Encounter   Medication Sig Dispense Refill    cephALEXin (KEFLEX) 500 MG capsule Take 1 capsule (500 mg total) by mouth 2 (two) times daily. 14 capsule 0    doxycycline (VIBRAMYCIN) 100 MG capsule Take 1 capsule (100 mg total) by mouth 2 (two) times daily. 28 capsule 0    ibuprofen (ADVIL) 600 MG tablet Take 1 tablet (600 mg total) by mouth every 6 (six) hours as needed. 30 tablet 0    omeprazole (PRILOSEC) 20 MG capsule Take 1 capsule (20 mg total) by mouth daily. 30 capsule 6     Physical Exam:  Vitals:    07/28/22 1223 07/28/22 1224   BP:  (!) 142/96   Pulse:  86   Resp:  20   Temp:  98.4 F (36.9 C)   SpO2: 97% 98%     Body mass index is 25.46 kg/m.  Alert and oriented x 3  No shortness of breath or chest pain  Abdomen soft and nontender.  No loss of bowel or bladder control, no rebound tenderness  Right lower extremity: There is a slight abrasion over the right tibia.  Compartments soft and nontender, 2+ dorsalis pedis/posterior tibialis pulses bilaterally.  5/5 EHL/tibialis anterior strength bilaterally.  Sensation light touch is intact throughout.  No knee or ankle swelling/pain with palpation.      Image:  DG Tibia/Fibula Right    Result Date:  07/28/2022  CLINICAL DATA:  Right leg injury while playing soccer. Right leg pain and inability to bear weight. EXAM: RIGHT TIBIA AND FIBULA - 2 VIEW COMPARISON:  None Available. FINDINGS: A comminuted nondisplaced fracture is seen involving the mid to distal tibial diaphysis. No other fractures or bone lesions identified. IMPRESSION: Comminuted, nondisplaced tibial shaft fracture. Electronically Signed   By: Danae Orleans M.D.   On: 07/28/2022 12:56      A/P:  Dominic Arnold is a very pleasant 27 year old gentleman who was in his usual state of good to excellent health until he was struck in the right tibia while playing soccer.  He noted immediate pain and inability to weight-bear.  He was seen in the emergency department here at [facility] and noted to have a nondisplaced tibia fracture.  As result Ortho consultation was requested.  Patient denied any loss of consciousness, visual deficits, or headaches.    Medical exam demonstrates a nondisplaced tibia fracture with no signs or symptoms of compartment syndrome.  I discussed the signs of a compartment syndrome with the patient and his parents who were present.  He plans on being transported back to his home in [place].  I stressed the need for him  to be seen in the next 7 days for further evaluation and ongoing care of his fracture.  A splint will be applied and he will be nonweightbearing with crutches.  Since he is transporting back to [place] I recommended a long-leg splint with the knee in gentle flexion.    Pain medication will be provided by the emergency department.  Electronically signed by Venita Lick, MD at 07/28/2022  2:22 PM EDT

## 2022-07-28 NOTE — Unmapped (Signed)
Formatting of this note is different from the original.      Chief Complaint: Right tibia fracture    History: It is a very pleasant 27 year old gentleman who was playing soccer and was struck in the right tibia.  He noted immediate discomfort and inability to weight-bear.  Imaging at the [facility] urgent emergency room demonstrated a nondisplaced tibia fracture.  Orthopedic consultation was requested.    Review of systems: No headaches, vision disturbances, loss of consciousness.  No nausea or vomiting.    Past Medical History:   Diagnosis Date    Abdominal pain      Allergies   Allergen Reactions    Lactose Intolerance (Gi)      No current facility-administered medications on file prior to encounter.     Current Outpatient Medications on File Prior to Encounter   Medication Sig Dispense Refill    cephALEXin (KEFLEX) 500 MG capsule Take 1 capsule (500 mg total) by mouth 2 (two) times daily. 14 capsule 0    doxycycline (VIBRAMYCIN) 100 MG capsule Take 1 capsule (100 mg total) by mouth 2 (two) times daily. 28 capsule 0    ibuprofen (ADVIL) 600 MG tablet Take 1 tablet (600 mg total) by mouth every 6 (six) hours as needed. 30 tablet 0    omeprazole (PRILOSEC) 20 MG capsule Take 1 capsule (20 mg total) by mouth daily. 30 capsule 6     Physical Exam:  Vitals:    07/28/22 1223 07/28/22 1224   BP:  (!) 142/96   Pulse:  86   Resp:  20   Temp:  98.4 F (36.9 C)   SpO2: 97% 98%     Body mass index is 25.46 kg/m.  He is alert and oriented x 3.  No shortness of breath or chest pain.  Abdomen soft and nontender.  No rebound tenderness.  Full range of motion in the upper extremity bilaterally.  Tenderness over the right midshaft tibia.  Superficial abrasion is noted.  Compartments are soft and nontender.  2+ dorsalis pedis/posterior tibialis pulses bilaterally.  EHL/tibialis anterior/gastrocnemius are intact with 5 out of 5 motor strength and normal sensation light touch throughout in the lower extremity.  No swelling or  tenderness of the knee or hip or ankle.    Image:  DG Tibia/Fibula Right    Result Date: 07/28/2022  CLINICAL DATA:  Right leg injury while playing soccer. Right leg pain and inability to bear weight. EXAM: RIGHT TIBIA AND FIBULA - 2 VIEW COMPARISON:  None Available. FINDINGS: A comminuted nondisplaced fracture is seen involving the mid to distal tibial diaphysis. No other fractures or bone lesions identified. IMPRESSION: Comminuted, nondisplaced tibial shaft fracture. Electronically Signed   By: Danae Orleans M.D.   On: 07/28/2022 12:56      A/P:  Dominic Arnold is a very pleasant 27 year old otherwise healthy individual who was playing soccer earlier today and was struck in the right anterior shin.  He noted immediate pain and inability to weight-bear on the right lower extremity.  There was no gross deformity, laceration, or exposed bone.  Patient was brought to the urgent care where imaging studies were done.  Patient has a nondisplaced midshaft fracture of the tibia..  As result Ortho consultation was requested.     Patient was evaluated in the urgent care setting and was noted to be neurovascularly intact.  Compartments are soft and non-tender, with a small abrasion over the midshaft of the tibia.  Patient had no other complaints.  There is no swelling or tenderness over the hip, knee, or ankle.  Imaging confirms a nondisplaced tibial shaft fracture.    Discussed treatment options with the patient.  He indicated that he is from [place] and was planning to return there today.  I have stressed to him the need to maintain the leg in the elevated position to prevent swelling and the importance of being seen within the next 5 to 7 days.  I have counseled him on the signs and symptoms of a compartment syndrome with progressive debilitating pain, numbness and dysesthesias, and a cool foot.  All of their questions were addressed.    A long-leg splint with the knee in flexion will be applied by the Ortho tech and the  patient will will clear crutch ambulation nonweightbearing on the right lower extremity.  Medications will be provided by the emergency department physician.      Electronically signed by Venita Lick, MD at 07/28/2022  2:15 PM EDT

## 2022-07-28 NOTE — ED Provider Notes (Signed)
Formatting of this note is different from the original.  Images from the original note were not included.    [facility] EMERGENCY DEPARTMENT AT [facility]  Provider Note    CSN: [id number]  Arrival date & time: 07/28/22  1204        History    Chief Complaint   Patient presents with    Leg Injury     Dominic Arnold is a 27 y.o. male who presented with right leg pain after being kicked in the leg while playing soccer.  Patient states his right leg hurts and that there is swelling on the distal half.  Patient is still able to move his foot but does endorse paresthesias and is unable to bear weight due to pain and feeling unstable.  Patient denied any skin color changes or open wounds.    Home Medications  Prior to Admission medications    Medication Sig Start Date End Date Taking? Authorizing Provider   HYDROcodone-acetaminophen (NORCO) 5-325 MG tablet Take 1 tablet by mouth every 6 (six) hours as needed for up to 5 days for moderate pain. 07/28/22 08/02/22 Yes Schuman, Beverly Gust, PA-C   cephALEXin (KEFLEX) 500 MG capsule Take 1 capsule (500 mg total) by mouth 2 (two) times daily. 12/24/14   Barrett Henle, PA-C   doxycycline (VIBRAMYCIN) 100 MG capsule Take 1 capsule (100 mg total) by mouth 2 (two) times daily. 11/15/20   Horton, Mayer Masker, MD   ibuprofen (ADVIL) 600 MG tablet Take 1 tablet (600 mg total) by mouth every 6 (six) hours as needed. 09/07/21   Roxy Horseman, PA-C   omeprazole (PRILOSEC) 20 MG capsule Take 1 capsule (20 mg total) by mouth daily. 01/26/13 07/27/13  Jon Gills, MD       Allergies     Lactose intolerance (gi)      Review of Systems    Review of Systems  See HPI  Physical Exam  Updated Vital Signs  BP (!) 142/96 (BP Location: Right Arm)   Pulse 86   Temp 98.4 F (36.9 C) (Oral)   Resp 20   Ht 6\' 1"  (1.854 m)   Wt 87.5 kg   SpO2 98%   BMI 25.46 kg/m   Physical Exam  Constitutional:       General: He is not in acute distress.  Cardiovascular:      Comments: 2+ bilateral  dorsalis pedis/posterior tibialis pulses with regular rate  Musculoskeletal:         General: Swelling, tenderness and deformity present.      Comments: Right lower leg: Obvious deformity with edema to mid distal tibia, tenderness to palpation with step-offs appreciated, able to range right ankle/toes   Skin:     Capillary Refill: Capillary refill takes 2 to 3 seconds.      Coloration: Skin is not pale.      Findings: No erythema.      Comments: No signs of open fracture   Neurological:      Mental Status: He is alert.      Comments: Paresthesias to right foot however still able to feel palpation     ED Results / Procedures / Treatments    Labs  (all labs ordered are listed, but only abnormal results are displayed)  Labs Reviewed - No data to display    EKG  None    Radiology  DG Tibia/Fibula Right    Result Date: 07/28/2022  CLINICAL DATA:  Right leg injury  while playing soccer. Right leg pain and inability to bear weight. EXAM: RIGHT TIBIA AND FIBULA - 2 VIEW COMPARISON:  None Available. FINDINGS: A comminuted nondisplaced fracture is seen involving the mid to distal tibial diaphysis. No other fractures or bone lesions identified. IMPRESSION: Comminuted, nondisplaced tibial shaft fracture. Electronically Signed   By: Danae Orleans M.D.   On: 07/28/2022 12:56      Procedures  Procedures     Medications Ordered in ED  Medications - No data to display    ED Course/ Medical Decision Making/ A&P        Medical Decision Making  Amount and/or Complexity of Data Reviewed  Radiology: ordered.    Lonia Skinner 27 y.o. presented today for right leg pain. Working DDx that I considered at this time includes, but not limited to, tibia fracture, Maisonneuve fracture, neurovascular compromise, ischemic limb,, ankle sprain, knee sprain.    R/o DDx:  Maisonneuve fracture, neurovascular compromise, ischemic limb,, ankle sprain, knee sprain: These are considered less likely due to history of present illness and physical exam  findings    Review of prior external notes: None    Unique Tests and My Interpretation:   Right tib-fib x-ray: Nondisplaced comminuted mid tibial fracture    Discussion with Independent Historian:  Parents    Discussion of Management of Tests:  Shon Baton, MD [facility]    Risk:  Medium: prescription drug management    Risk Stratification Score: None    Staffed with Rancour, MD    Plan:  Patient presented for right leg pain. On exam patient was in no acute distress and stable vitals.  On exam patient had cap refill of 2 to 3 seconds and endorsed paresthesias in his foot however he was able to feel when I palpated and had full active range of motion in his feet and had good pulses.  Patient had obvious deformity to his right leg with step-off noted however no signs of open fracture.  Orthopedics was consulted and said they would come by to evaluate the patient and the patient will most likely need a long-leg splint with crutches and be seen in the outpatient setting.  Patient stable at this time and not requiring pain meds.    Orthopedics came and evaluated the patient.  Orthopedics spoke about return precautions including compartment syndrome, worsening symptoms.  Orthopedics also spoke to the patient about when the patient needs to follow-up and instructed that patient receives long-leg splint with stirrup held in slight flexion at the knee and be given crutches.  Patient will be given Norco for pain not covered by ibuprofen and was educated that he may take ibuprofen every 6 hours needed for pain.    Patient was given return precautions. Patient stable for discharge at this time.  Patient verbalized understanding of plan.    Final Clinical Impression(s) / ED Diagnoses  Final diagnoses:   Closed nondisplaced comminuted fracture of shaft of left tibia, initial encounter     Rx / DC Orders  ED Discharge Orders            Ordered     HYDROcodone-acetaminophen (NORCO) 5-325 MG tablet  Every 6 hours PRN         07/28/22  1413                 Remi Deter  07/28/22 1454      Glynn Octave, MD  07/28/22 1646    Electronically signed by Glynn Octave, MD  at 07/28/2022  4:46 PM EDT    Associated attestation - Glynn Octave, MD - 07/28/2022  4:46 PM EDT  Formatting of this note might be different from the original.  Attestation: I provided a substantive portion of the care of this patient.  I personally made/approved the management plan for this patient and take responsibility for the patient management.     Kicked in R leg while playing soccer. No head injury.     TTP R mid tibia. Compartments soft. Intact DP and PT pulses. Able to wiggle toes, flex and extend ankle.     Midshaft tibia fracture d/w Dr. Shon Baton who evaluated patient in person and will arrangement followup. Compartment syndrome precautions given to patient by Dr. Shon Baton.

## 2022-07-28 NOTE — ED Notes (Signed)
Formatting of this note might be different from the original.  Pt resting with family at beside. No needs at this time, call light in reach.   Electronically signed by Bryan Lemma, RN at 07/28/2022 12:58 PM EDT

## 2022-07-28 NOTE — ED Triage Notes (Signed)
Formatting of this note might be different from the original.  Pt via pov from home wit injury to right shin. It was injured while playing soccer. Obvious swelling to shin, and pt states he is unable to bear weight; states his ankle and toes are tingling. Cap refill <2 seconds. Pt alert & oriented, nad noted.   Electronically signed by Pierce Crane, RN at 07/28/2022 12:23 PM EDT

## 2022-07-31 ENCOUNTER — Ambulatory Visit: Admit: 2022-07-31 | Discharge: 2022-07-31 | Payer: PRIVATE HEALTH INSURANCE | Attending: Specialist

## 2022-07-31 DIAGNOSIS — S82201A Unspecified fracture of shaft of right tibia, initial encounter for closed fracture: Secondary | ICD-10-CM

## 2022-07-31 NOTE — Progress Notes (Incomplete)
Dominic Arnold (DOB: 01-Jul-1995) is a 27 y.o. male, patient,here for evaluation of the following   Chief Complaint   Patient presents with    New Patient     Right fracture tibia, playing soccer         ASSESSMENT/PLAN:  Below is the assessment and plan developed based on review of pertinent history, physical exam, labs, studies, and medications.      1. Closed traumatic nondisplaced fracture of shaft of right tibia, initial encounter  -     PR APPLICATION CYLINDER CAST THIGH ANKLE  -     LONG LEG CAST SUPPLIES, ADULT (11 YEARS +), (Q4030)  2. Pain of right tibia    Patient verbalized understanding of exam findings and treatment plan.  We engaged in the shared decision-making process and treatment options were discussed at length with the patient.  Surgical and conservative management discussed today along with risk and benefits.    ***  I discussed management options with the patient.  All questions were answered to the best of my ability and to the patient's satisfaction.  I discussed their history, symptoms, physical exam findings, diagnostic testing results, diagnosis and treatment options.  The patient verbalized understanding and elected to proceed with *** treatment as above.    Return in about 3 weeks (around 08/21/2022) for x-rays next follow up. At [place] office around 10:30 to 11 am.      Allergies   Allergen Reactions    Lactose Intolerance (Gi)          Current Outpatient Medications:     ibuprofen (ADVIL;MOTRIN) 200 MG tablet, Take 1 tablet by mouth every 6 hours as needed for Pain, Disp: , Rfl:     ondansetron (ZOFRAN-ODT) 4 MG disintegrating tablet, Place 1 tablet every 6-8 hours by translingual route as needed., Disp: , Rfl:      Past Medical History:   Diagnosis Date    Tear of MCL (medial collateral ligament) of knee        Past Surgical History:   Procedure Laterality Date    KNEE ARTHROSCOPY W/ MEDIAL COLLATERAL LIGAMENT (MCL) REPAIR         No family history on file.    Social History      Socioeconomic History    Marital status: Married     Spouse name: Not on file    Number of children: Not on file    Years of education: Not on file    Highest education level: Not on file   Occupational History    Not on file   Tobacco Use    Smoking status: Never    Smokeless tobacco: Never   Substance and Sexual Activity    Alcohol use: Never    Drug use: Never    Sexual activity: Not on file   Other Topics Concern    Not on file   Social History Narrative    Not on file     Social Determinants of Health     Financial Resource Strain: Not on file   Food Insecurity: Not on file   Transportation Needs: Not on file   Physical Activity: Not on file   Stress: Not on file   Social Connections: Not on file   Intimate Partner Violence: Not on file   Housing Stability: Not on file           Vitals:  Ht 1.854 m (6\' 1" )   Wt 88 kg (194 lb)  BMI 25.60 kg/m    Body mass index is 25.6 kg/m.            SUBJECTIVE:  Dominic Arnold (DOB: [birth year]) ***         OBJECTIVE EXAM:     Ht 1.854 m (6\' 1" )   Wt 88 kg (194 lb)   BMI 25.60 kg/m     Appearance: Alert, well appearing and pleasant patient who is in no distress, oriented to person, place/time, and who follows commands. This patient is accompanied in the       office by his  {companion:[id number]}.   Psychiatric: Affect and mood are appropriate. No dementia noted on examination  Musculoskeletal:  LOCATION: ***{body parts:53560}      Integumentary: No rashes, skin patches, wounds, or abrasions to the right or left legs       Warm and normal color. No regions of expressible drainage.      Gait: Normal      Tenderness: No tenderness        Motor/Strength/Tone Exam: Normal       Sensory Exam:   Intact Normal Sensation to ankle/foot      Stability Testing: No anterolateral or varus instability of the Ankle or Subtalar Joints               No peroneal tendon instability noted      ROM: Normal ROM noted to ankle/foot      Contractures: No Achilles or Gastrocnemius Contractures       Calf tenderness: Absent for calf or gastrocnemius muscle regions       Soft, supple, non tender, non taut lower extremity compartment  Alignment:      NEUTRAL Hindfoot,         none Metatarsus Adductus Metatarsus   Wounds/Abrasions:    None present  Extremities:   No embolic phenomena to the toes          No significant edema to the foot and or toes.        Lower extremities are warm and appear well perfused    DVT: No evidence of DVT seen on examination at this time     No calf swelling, no tenderness to calf muscles  Lymphatic:  No Evidence of Lymphedema  Vascular: Medial Border of Tibia Region: Edema {IS/IS ZOX:09604} present         Pulses: Dorsalis Pedis &  Posterior Tibial Pulses : Palpable yes        Varicosities Lower Limbs :  None  noted  Neuro: Negative bilateral Straight leg raise (seated position)    See Musculoskeletal section for pertinent individual extremity examination    No abnormal hand/wrist, foot/ankle, or facial/neck tremors.    Lower Extremity/Ankle/Foot:  ***gait, ***weightbearing stance.    ***lower extremity/ankle:    ***foot:    ***Lower extremity: Skin intact without erythema or wounds.  2+ dorsalis pedis pulse.  Toes are warm, and well-perfused.  Sensation is intact to light touch in the DP, SP, sural, saphenous, and tibial nerve distributions.  5/5 strength in ankle dorsiflexion, plantarflexion, inversion, and eversion.  Ankle range of motion is 10 degrees of dorsiflexion to 40 degrees of plantarflexion.  Painless hindfoot and midfoot range of motion.  No severe hallux, lesser toe malalignment or deformities, no pain with passive motion of lesser MTP joints, hallux MTP joint, no first TMT instability.      Imaging:    Patient brought x-rays on disc, these films are dated July 28, 2022.  They are of the right tibia and fibula AP and lateral views show a midshaft nondisplaced tibia fracture visible on both views.  The fibula is intact.  Ankle joint appears intact.  These films from an  outside facility were independently reviewed on PACS after the films transition from a disc.        Please note that this dictation was completed with Nuance dictaphone, the computer voice recognition software. Quite often unanticipated grammatical, syntax, homophones, and other interpretive errors are inadvertently transcribed by the computer software. Please disregard these errors. Please excuse any errors that have escaped final proofreading.     An electronic signature was used to authenticate this note.  -- Gwenyth Ober, MD, Orthopedic Surgeon

## 2022-08-03 ENCOUNTER — Encounter: Payer: PRIVATE HEALTH INSURANCE | Attending: Specialist

## 2022-08-16 ENCOUNTER — Ambulatory Visit: Admit: 2022-08-16 | Payer: PRIVATE HEALTH INSURANCE

## 2022-08-16 ENCOUNTER — Ambulatory Visit: Admit: 2022-08-16 | Discharge: 2022-08-16 | Payer: PRIVATE HEALTH INSURANCE | Attending: Specialist

## 2022-08-16 DIAGNOSIS — S82201D Unspecified fracture of shaft of right tibia, subsequent encounter for closed fracture with routine healing: Secondary | ICD-10-CM

## 2022-08-16 NOTE — Progress Notes (Unsigned)
Dominic Arnold (DOB: 02-29-1996) is a 27 y.o. male, patient,here for evaluation of the following No chief complaint on file.       ASSESSMENT/PLAN:  Below is the assessment and plan developed based on review of pertinent history, physical exam, labs, studies, and medications.      1. Closed traumatic nondisplaced fracture of shaft of right tibia with routine healing, subsequent encounter  -     XR TIBIA FIBULA RIGHT (2 VIEWS); Future  -     PR CAST SUP SHORT LEG SPLINT ADULT FIBERGLASS  -     PR APPLICATION SHORT LEG CAST BELOW KNEE-TOE    Patient verbalized understanding of exam findings and treatment plan.  We engaged in the shared decision-making process and treatment options were discussed at length with the patient.  Surgical and conservative management discussed today along with risk and benefits.    ***  I discussed management options with the patient.  All questions were answered to the best of my ability and to the patient's satisfaction.  I discussed their history, symptoms, physical exam findings, diagnostic testing results, diagnosis and treatment options.  The patient verbalized understanding and elected to proceed with *** treatment as above.    Return in about 4 weeks (around 09/13/2022) for x-rays next follow up out of cast right tib/fib 2 views.       Allergies   Allergen Reactions    Lactose Intolerance (Gi)          Current Outpatient Medications:     ibuprofen (ADVIL;MOTRIN) 200 MG tablet, Take 1 tablet by mouth every 6 hours as needed for Pain, Disp: , Rfl:      Past Medical History:   Diagnosis Date    Tear of MCL (medial collateral ligament) of knee        Past Surgical History:   Procedure Laterality Date    KNEE ARTHROSCOPY W/ MEDIAL COLLATERAL LIGAMENT (MCL) REPAIR         No family history on file.    Social History     Socioeconomic History    Marital status: Married     Spouse name: Not on file    Number of children: Not on file    Years of education: Not on file    Highest education level:  Not on file   Occupational History    Not on file   Tobacco Use    Smoking status: Never    Smokeless tobacco: Never   Substance and Sexual Activity    Alcohol use: Never    Drug use: Never    Sexual activity: Not on file   Other Topics Concern    Not on file   Social History Narrative    Not on file     Social Determinants of Health     Financial Resource Strain: Not on file   Food Insecurity: Not on file   Transportation Needs: Not on file   Physical Activity: Not on file   Stress: Not on file   Social Connections: Not on file   Intimate Partner Violence: Not on file   Housing Stability: Not on file           Vitals:  There were no vitals taken for this visit.   There is no height or weight on file to calculate BMI.            SUBJECTIVE:  Dominic Arnold (DOB: 07/02/1995) ***         OBJECTIVE EXAM:  There were no vitals taken for this visit.    Appearance: Alert, well appearing and pleasant patient who is in no distress, oriented to person, place/time, and who follows commands. This patient is accompanied in the       office by his  {companion:[id number]}.   Psychiatric: Affect and mood are appropriate. No dementia noted on examination  Musculoskeletal:  LOCATION: ***{body parts:53560}      Integumentary: No rashes, skin patches, wounds, or abrasions to the right or left legs       Warm and normal color. No regions of expressible drainage.      Gait: Normal      Tenderness: No tenderness        Motor/Strength/Tone Exam: Normal       Sensory Exam:   Intact Normal Sensation to ankle/foot      Stability Testing: No anterolateral or varus instability of the Ankle or Subtalar Joints               No peroneal tendon instability noted      ROM: Normal ROM noted to ankle/foot      Contractures: No Achilles or Gastrocnemius Contractures      Calf tenderness: Absent for calf or gastrocnemius muscle regions       Soft, supple, non tender, non taut lower extremity compartment  Alignment:      NEUTRAL Hindfoot,         none  Metatarsus Adductus Metatarsus   Wounds/Abrasions:    None present  Extremities:   No embolic phenomena to the toes          No significant edema to the foot and or toes.        Lower extremities are warm and appear well perfused    DVT: No evidence of DVT seen on examination at this time     No calf swelling, no tenderness to calf muscles  Lymphatic:  No Evidence of Lymphedema  Vascular: Medial Border of Tibia Region: Edema {IS/IS ZOX:09604} present         Pulses: Dorsalis Pedis &  Posterior Tibial Pulses : Palpable yes        Varicosities Lower Limbs :  None  noted  Neuro: Negative bilateral Straight leg raise (seated position)    See Musculoskeletal section for pertinent individual extremity examination    No abnormal hand/wrist, foot/ankle, or facial/neck tremors.    Lower Extremity/Ankle/Foot:  ***gait, ***weightbearing stance.    ***lower extremity/ankle:    ***foot:    ***Lower extremity: Skin intact without erythema or wounds.  2+ dorsalis pedis pulse.  Toes are warm, and well-perfused.  Sensation is intact to light touch in the DP, SP, sural, saphenous, and tibial nerve distributions.  5/5 strength in ankle dorsiflexion, plantarflexion, inversion, and eversion.  Ankle range of motion is 10 degrees of dorsiflexion to 40 degrees of plantarflexion.  Painless hindfoot and midfoot range of motion.  No severe hallux, lesser toe malalignment or deformities, no pain with passive motion of lesser MTP joints, hallux MTP joint, no first TMT instability.      Imaging:            Please note that this dictation was completed with Nuance dictaphone, the computer voice recognition software. Quite often unanticipated grammatical, syntax, homophones, and other interpretive errors are inadvertently transcribed by the computer software. Please disregard these errors. Please excuse any errors that have escaped final proofreading.     An electronic signature was used to authenticate this note.  --  Gwenyth Ober, MD, Orthopedic  Surgeon

## 2022-08-17 NOTE — Telephone Encounter (Signed)
From: Lonia Skinner  To: Dr. Gwenyth Ober  Sent: 08/14/2022 4:44 PM EDT  Subject: Cast is loose    Hello Dominic Arnold,     I feel like the cast is getting very loose my ankle can move in any angle. I tend to stretch a lot naturally in my sleep and in boredom without realizing and I feel like I over stretched the cast, now my leg is getting very sore and sometimes painful, I can bear the pain, but will this slow down the healing process? Do you think I need to get a new one now to finish off the last week of wearing a cast?

## 2022-08-22 ENCOUNTER — Encounter: Payer: PRIVATE HEALTH INSURANCE | Attending: Specialist

## 2022-09-13 ENCOUNTER — Encounter: Admit: 2022-09-13 | Discharge: 2022-09-13 | Payer: PRIVATE HEALTH INSURANCE | Attending: Specialist

## 2022-09-13 ENCOUNTER — Ambulatory Visit: Admit: 2022-09-13 | Payer: PRIVATE HEALTH INSURANCE

## 2022-09-13 DIAGNOSIS — S82201D Unspecified fracture of shaft of right tibia, subsequent encounter for closed fracture with routine healing: Secondary | ICD-10-CM

## 2022-09-13 NOTE — Progress Notes (Signed)
Dominic Arnold (DOB: [birth year]) is a 27 y.o. male, patient,here for evaluation of the following   Chief Complaint   Patient presents with    Follow-up     Follow up right tibia fx        ASSESSMENT/PLAN:  Below is the assessment and plan developed based on review of pertinent history, physical exam, labs, studies, and medications.      1. Closed traumatic nondisplaced fracture of shaft of right tibia with routine healing, subsequent encounter  -     XR TIBIA FIBULA RIGHT (2 VIEWS); Future  -     Ambulatory Referral to DME  -     SHELL AIR ANKLE WALKER, TALL    Patient is back today, he is over 2 months since date of injury to the right lower extremity which is a slightly distal tibia shaft fracture which was nondisplaced.  After he had presented for his injury, I had recommended a nail over conservative treatment only because there is a quicker recovery but he declined that treatment and elected to proceed with conservative treatment.  He has been casted since since initial evaluation.  He denies any chest pain, shortness of breath or calf pain.  He had new x-rays today and these films were independently reviewed today.  These x-rays show bridging callus at the fracture and there is no change in position of the tibia which remains nondisplaced.  These films were shown to the patient today.    He had been in a cast today and that was removed.  Since there is bridging callus at the tibia and he is mostly asymptomatic in terms of evaluation with no pain with deep palpation and he is moving his knee and ankle without pain, I recommended a tall boot but will remain mostly nonweightbearing for the next 3 to 4 weeks and then start weightbearing in the boot thereafter.  If he is experiencing more pain or swelling, he may need to step back and hold off on the weightbearing in the boot until reevaluation.  Again informed that these will take a long time to heal.  He may still have flareup of pain and swelling if he does too  much activities.  He is fitted with a tall Aircast boot today.    Next time he returns we will get new x-rays of the right tibia and fibula 2 views.    Return in about 4 weeks (around 10/11/2022) for x-rays next follow up.       Allergies   Allergen Reactions    Lactose Intolerance (Gi)          Current Outpatient Medications:     ibuprofen (ADVIL;MOTRIN) 200 MG tablet, Take 1 tablet by mouth every 6 hours as needed for Pain, Disp: , Rfl:      Past Medical History:   Diagnosis Date    Tear of MCL (medial collateral ligament) of knee        Past Surgical History:   Procedure Laterality Date    KNEE ARTHROSCOPY W/ MEDIAL COLLATERAL LIGAMENT (MCL) REPAIR         No family history on file.    Social History     Socioeconomic History    Marital status: Married     Spouse name: Not on file    Number of children: Not on file    Years of education: Not on file    Highest education level: Not on file   Occupational History    Not  on file   Tobacco Use    Smoking status: Never    Smokeless tobacco: Never   Substance and Sexual Activity    Alcohol use: Never    Drug use: Never    Sexual activity: Not on file   Other Topics Concern    Not on file   Social History Narrative    Not on file     Social Determinants of Health     Financial Resource Strain: Not on file   Food Insecurity: Not on file   Transportation Needs: Not on file   Physical Activity: Not on file   Stress: Not on file   Social Connections: Not on file   Intimate Partner Violence: Not on file   Housing Stability: Not on file           Vitals:  Ht 1.854 m (6\' 1" )   Wt 88 kg (194 lb)   BMI 25.60 kg/m    Body mass index is 25.6 kg/m.            SUBJECTIVE:  Dominic Arnold (DOB: 1995-04-19)     Patient returns today for reevaluation of the right tibia shaft fracture which was a nondisplaced injury.  He initially presented for this problem on July 31, 2022 so he is about near 2 months since date of injury.  He was initially placed into a long-leg cast after he elected  to proceed with conservative treatment.  I had recommended tibial nail for quicker recovery but he did not wish to proceed with surgery.  So he presents today, last time seen we have transition him to a short leg cast since he was more stable and not experiencing much pain and was not tolerating the long leg cast anymore.  He is doing okay today he has no complaints.  He states the cast is getting loose and he is not feeling much pain right now.        OBJECTIVE EXAM:     Ht 1.854 m (6\' 1" )   Wt 88 kg (194 lb)   BMI 25.60 kg/m     Appearance: Male, alert, well appearing and pleasant patient who is in no distress, oriented to person, place/time, and who follows commands. This patient is accompanied in the       office by his  wife.   Psychiatric: Affect and mood are appropriate. No dementia noted on examination  Musculoskeletal:  LOCATION: Minimal tenderness or swelling leg- right      Integumentary: No rashes, skin patches, wounds, or abrasions to the right or left legs       Warm and normal color. No regions of expressible drainage.      Gait: Normal      Tenderness: No tenderness        Motor/Strength/Tone Exam: Normal       Sensory Exam:   Intact Normal Sensation to ankle/foot      Stability Testing: No anterolateral or varus instability of the Ankle or Subtalar Joints               No peroneal tendon instability noted      ROM: Normal ROM noted to ankle/foot      Contractures: No Achilles or Gastrocnemius Contractures      Calf tenderness: Absent for calf or gastrocnemius muscle regions       Soft, supple, non tender, non taut lower extremity compartment  Alignment:      NEUTRAL Hindfoot,  none Metatarsus Adductus Metatarsus   Wounds/Abrasions:    None present  Extremities:   No embolic phenomena to the toes          No significant edema to the foot and or toes.        Lower extremities are warm and appear well perfused    DVT: No evidence of DVT seen on examination at this time     No calf swelling,  no tenderness to calf muscles  Lymphatic:  No Evidence of Lymphedema  Vascular: Medial Border of Tibia Region: Edema is not present         Pulses: Dorsalis Pedis &  Posterior Tibial Pulses : Palpable yes        Varicosities Lower Limbs :  None  noted  Neuro: Negative bilateral Straight leg raise (seated position)    See Musculoskeletal section for pertinent individual extremity examination    No abnormal hand/wrist, foot/ankle, or facial/neck tremors.    Lower Extremity/Ankle/Foot:  Nonweightbearing gait, limited weightbearing stance.    Right lower extremity/ankle: There is still some tenderness to palpation of the tibia but overall alignment is good without deformity.  He has calf atrophy from the injury mostly due to disuse.  His fibula is nontender.  He has range of motion intact at the knee mostly full, ankle range of motion is satisfactory but not full.  Skin intact without open wounds or lesions.    Right foot: No malalignment or deformity, nontender, no swelling, neurovascular exams grossly intact similar to contralateral.        Imaging:    Xray Result (most recent):  XR TIBIA FIBULA RIGHT (2 VIEWS) 09/13/2022    Narrative  Right tibia and fibula AP and lateral views show bridging callus at the fracture site.  There is good alignment both views.  Fracture is healing, fracture line still visible.           Please note that this dictation was completed with Nuance dictaphone, the computer voice recognition software. Quite often unanticipated grammatical, syntax, homophones, and other interpretive errors are inadvertently transcribed by the computer software. Please disregard these errors. Please excuse any errors that have escaped final proofreading.     An electronic signature was used to authenticate this note.  -- Gwenyth Ober, MD, Orthopedic Surgeon

## 2022-10-17 ENCOUNTER — Ambulatory Visit: Admit: 2022-10-17 | Discharge: 2022-10-17 | Payer: PRIVATE HEALTH INSURANCE | Attending: Specialist

## 2022-10-17 ENCOUNTER — Ambulatory Visit: Admit: 2022-10-17 | Discharge: 2022-10-21 | Payer: PRIVATE HEALTH INSURANCE

## 2022-10-17 VITALS — Ht 73.0 in | Wt 194.0 lb

## 2022-10-17 DIAGNOSIS — S82201D Unspecified fracture of shaft of right tibia, subsequent encounter for closed fracture with routine healing: Secondary | ICD-10-CM

## 2022-10-28 NOTE — Progress Notes (Signed)
Dominic Arnold (DOB: 1995-09-29) is a 27 y.o. male, patient,here for evaluation of the following   Chief Complaint   Patient presents with    Follow-up     Follow up right tibia fracture        ASSESSMENT/PLAN:  Below is the assessment and plan developed based on review of pertinent history, physical exam, labs, studies, and medications.      1. Closed traumatic nondisplaced fracture of shaft of right tibia with routine healing, subsequent encounter  -     XR TIBIA FIBULA RIGHT (2 VIEWS); Future    The x-rays obtained today were independently reviewed on PACS and the results were documented in the physical examination portion of this note.  The right tibia/fibula AP and lateral x-rays show the fracture is healing but not complete.  There is a slight change in position to the tibia position but no severe deformities as the fibula is otherwise intact.  These films were shown to the patient.      Patient returns today for follow-up, he is about 2 months out since date of injury which was a tibial shaft fracture nondisplaced injury.  He was in a long leg cast for period of time and then into a short leg cast and eventually transition to a boot and was to remain in boot up to 3 months since date of injury.  He has since taken the boot off and not using the boot.  I informed the patient he is still at risk for complications such as if he were to fall, he could break this fracture and in that case I will definitely recommend surgery over nonsurgical treatment.  Patient had elected to undergo conservative treatment and did not want to proceed with surgery at the time he initially presented.  Surgical treatment was offered to him and I recommended that treatment so he can get back on his feet much quicker such that he would be immediately weightbearing at some point after surgery.  Since he elected to proceed with conservative treatment, it does require period of immobilization and then protected weightbearing and in most  cases I will protect weightbearing for up to 3 months possibly more depending on the health of the patient or healing process of the fracture.  Patient wants to proceed with exercises, recommended exercises that do not involve high impact activities such as running or jumping or lifting greater than 50 pounds.    Again I do not recommend high impact until he is fully cleared and at this time he is not fully cleared.  Next time he returns we will get new x-rays of the right tibia and fibula 2 views.  Return in about 2 months (around 12/18/2022) for x-rays next follow up.       Allergies   Allergen Reactions    Lactose Intolerance (Gi)        No current outpatient medications on file.     Past Medical History:   Diagnosis Date    Tear of MCL (medial collateral ligament) of knee        Past Surgical History:   Procedure Laterality Date    KNEE ARTHROSCOPY W/ MEDIAL COLLATERAL LIGAMENT (MCL) REPAIR         No family history on file.    Social History     Socioeconomic History    Marital status: Married     Spouse name: Not on file    Number of children: Not on file  Years of education: Not on file    Highest education level: Not on file   Occupational History    Not on file   Tobacco Use    Smoking status: Never    Smokeless tobacco: Never   Substance and Sexual Activity    Alcohol use: Never    Drug use: Never    Sexual activity: Not on file   Other Topics Concern    Not on file   Social History Narrative    Not on file     Social Determinants of Health     Financial Resource Strain: Not on file   Food Insecurity: Not on file   Transportation Needs: Not on file   Physical Activity: Not on file   Stress: Not on file   Social Connections: Not on file   Intimate Partner Violence: Not on file   Housing Stability: Not on file           Vitals:  Ht 1.854 m (6\' 1" )   Wt 88 kg (194 lb)   BMI 25.60 kg/m    Body mass index is 25.6 kg/m.            SUBJECTIVE:  Dominic Arnold (DOB: 1995-09-07)     Patient returns today for  reevaluation of the right tibia nondisplaced fracture and he was supposed to remain in the boot up to 3 months since injury.  He is 2 months out from injury and he is not wearing the boot.  He states he is doing well but still has some intermittent discomfort and swelling.        OBJECTIVE EXAM:     Ht 1.854 m (6\' 1" )   Wt 88 kg (194 lb)   BMI 25.60 kg/m     Appearance: Male, alert, well appearing and pleasant patient who is in no distress, oriented to person, place/time, and who follows commands. This patient is accompanied in the       office by his wife.  Psychiatric: Affect and mood are appropriate. No dementia noted on examination  Musculoskeletal:  LOCATION: Tenderness with minimal swelling tibia leg - right      Integumentary: No rashes, skin patches, wounds, or abrasions to the right or left legs       Warm and normal color. No regions of expressible drainage.      Gait: Normal      Tenderness: No tenderness        Motor/Strength/Tone Exam: Normal       Sensory Exam:   Intact Normal Sensation to ankle/foot      Stability Testing: No anterolateral or varus instability of the Ankle or Subtalar Joints               No peroneal tendon instability noted      ROM: Normal ROM noted to ankle/foot      Contractures: No Achilles or Gastrocnemius Contractures      Calf tenderness: Absent for calf or gastrocnemius muscle regions       Soft, supple, non tender, non taut lower extremity compartment  Alignment:      NEUTRAL Hindfoot,         none Metatarsus Adductus Metatarsus   Wounds/Abrasions:    None present  Extremities:   No embolic phenomena to the toes          No significant edema to the foot and or toes.        Lower extremities are warm and appear well  perfused    DVT: No evidence of DVT seen on examination at this time     No calf swelling, no tenderness to calf muscles  Lymphatic:  No Evidence of Lymphedema  Vascular: Medial Border of Tibia Region: Edema is not present         Pulses: Dorsalis Pedis &   Posterior Tibial Pulses : Palpable yes        Varicosities Lower Limbs :  None  noted  Neuro: Negative bilateral Straight leg raise (seated position)    See Musculoskeletal section for pertinent individual extremity examination    No abnormal hand/wrist, foot/ankle, or facial/neck tremors.    Lower Extremity/Ankle/Foot:  Antalgic gait, satisfactory weightbearing stance.  Not using any assistive devices.    Right lower extremity/ankle: Calves are soft nontender.  Ligaments are grossly stable.  Tibia still tender with deep palpation, there is a palpable callus.  Fibula nontender.  Satisfactory overall alignment.  Negative Thompson test.  Negative ankle squeeze test.  Skin intact without open wounds or lesions.    Right foot: Nontender, no swelling.  Ligaments stable.  Neurovascular exam grossly intact similar to contralateral.      Imaging:    Xray Result (most recent):  XR TIBIA FIBULA RIGHT (2 VIEWS) 10/17/2022    Narrative  Right tibia and fibula AP and lateral x-rays show the distal shaft tibia fracture is healing, there is bridging ossification but the fracture line is still visible.  The fracture although healing is not complete.  Overall alignment remains satisfactory without severe displacement           Please note that this dictation was completed with Nuance dictaphone, the computer voice recognition software. Quite often unanticipated grammatical, syntax, homophones, and other interpretive errors are inadvertently transcribed by the computer software. Please disregard these errors. Please excuse any errors that have escaped final proofreading.     An electronic signature was used to authenticate this note.  -- Gwenyth Ober, MD, Orthopedic Surgeon

## 2022-12-19 ENCOUNTER — Ambulatory Visit: Admit: 2022-12-19 | Payer: PRIVATE HEALTH INSURANCE

## 2022-12-19 ENCOUNTER — Ambulatory Visit: Admit: 2022-12-19 | Discharge: 2022-12-19 | Payer: PRIVATE HEALTH INSURANCE | Attending: Specialist

## 2022-12-19 VITALS — Ht 73.0 in | Wt 194.0 lb

## 2022-12-19 DIAGNOSIS — S82201D Unspecified fracture of shaft of right tibia, subsequent encounter for closed fracture with routine healing: Secondary | ICD-10-CM

## 2022-12-19 NOTE — Progress Notes (Signed)
 Dominic Arnold (DOB: 08/20/1995) is a 27 y.o. male, patient,here for evaluation of the following   Chief Complaint   Patient presents with    Post-Op Check     Right tibia        ASSESSMENT/PLAN:  Below is the assessment and plan developed based on review of pertinent history, physical exam, labs, studies, and medications.      1. Closed traumatic nondisplaced fracture of shaft of right tibia with routine healing, subsequent encounter  -     XR TIBIA FIBULA RIGHT (2 VIEWS); Future    Patient is here for final evaluation of the right tibia fracture that was a nondisplaced injury.  He elected to proceed with conservative treatment was treated in a long-leg cast and then a short leg cast in a boot.  Today he is doing well since the injury July 28, 2022.  He was playing soccer at the time of his injury when this happened in [place] .  He was splinted in a long-leg splint and return to [place] .  He did not wish to proceed with surgery and we proceeded with conservative treatment.  He is doing very well today he has no complaints.  He has little bit of discomfort at times but overall he feels he is finally recovered.  He will gradually return to all activities as tolerated.  I did not recommend returning to soccer until another 2 months from now to allow further healing.  He should continue to work on exercises to improve his strength, balance proprioception overall conditioning program.  He is considered healed at this time and is released from care today.    Return if symptoms worsen or fail to improve.       Allergies   Allergen Reactions    Lactose Intolerance (Gi)        No current outpatient medications on file.     Past Medical History:   Diagnosis Date    Tear of MCL (medial collateral ligament) of knee        Past Surgical History:   Procedure Laterality Date    KNEE ARTHROSCOPY W/ MEDIAL COLLATERAL LIGAMENT (MCL) REPAIR         No family history on file.    Social History     Socioeconomic History     Marital status: Married     Spouse name: Not on file    Number of children: Not on file    Years of education: Not on file    Highest education level: Not on file   Occupational History    Not on file   Tobacco Use    Smoking status: Never    Smokeless tobacco: Never   Substance and Sexual Activity    Alcohol use: Never    Drug use: Never    Sexual activity: Not on file   Other Topics Concern    Not on file   Social History Narrative    Not on file     Social Determinants of Health     Financial Resource Strain: Not on file   Food Insecurity: Not on file   Transportation Needs: Not on file   Physical Activity: Not on file   Stress: Not on file   Social Connections: Not on file   Intimate Partner Violence: Not on file   Housing Stability: Not on file           Vitals:  Ht 1.854 m (6' 1)   Wt 88 kg (  194 lb)   BMI 25.60 kg/m    Body mass index is 25.6 kg/m.            SUBJECTIVE:  Dominic Arnold (DOB: 10-08-1995)     Patient returns for an injury sustained at the right tibia shaft fracture which was nondisplaced sustained on July 28, 2022 while playing soccer in [place] .  He transition back to [place]  where he resides and elected to proceed with conservative treatment over nail fixation.  He is doing okay today he has no complaints.  He has returned to most activities except sports        OBJECTIVE EXAM:     Ht 1.854 m (6' 1)   Wt 88 kg (194 lb)   BMI 25.60 kg/m     Appearance: Male, alert, well appearing and pleasant patient who is in no distress, oriented to person, place/time, and who follows commands. This patient is accompanied in the       office by his  wife.   Psychiatric: Affect and mood are appropriate. No dementia noted on examination  Musculoskeletal:  LOCATION: Minimal tenderness or swelling tibia leg - right      Integumentary: No rashes, skin patches, wounds, or abrasions to the right or left legs       Warm and normal color. No regions of expressible drainage.      Gait: Normal       Tenderness: No tenderness        Motor/Strength/Tone Exam: Normal       Sensory Exam:   Intact Normal Sensation to ankle/foot      Stability Testing: No anterolateral or varus instability of the Ankle or Subtalar Joints               No peroneal tendon instability noted      ROM: Normal ROM noted to ankle/foot      Contractures: No Achilles or Gastrocnemius Contractures      Calf tenderness: Absent for calf or gastrocnemius muscle regions       Soft, supple, non tender, non taut lower extremity compartment  Alignment:      NEUTRAL Hindfoot,         none Metatarsus Adductus Metatarsus   Wounds/Abrasions:    None present  Extremities:   No embolic phenomena to the toes          No significant edema to the foot and or toes.        Lower extremities are warm and appear well perfused    DVT: No evidence of DVT seen on examination at this time     No calf swelling, no tenderness to calf muscles  Lymphatic:  No Evidence of Lymphedema  Vascular: Medial Border of Tibia Region: Edema is not present         Pulses: Dorsalis Pedis &  Posterior Tibial Pulses : Palpable yes        Varicosities Lower Limbs :  None  noted  Neuro: Negative bilateral Straight leg raise (seated position)    See Musculoskeletal section for pertinent individual extremity examination    No abnormal hand/wrist, foot/ankle, or facial/neck tremors.    Lower Extremity/Ankle/Foot:  Mostly normal gait, normal weightbearing stance.    Right lower extremity/ankle: No malalignment or deformity, nontender at the previous tibia fracture with deep palpation, no tenderness at fibula, tibiotalar joint nontender.  no swelling.  He still has mild calf atrophy compared to contralateral.  For plantarflexion, inversion and eversion is otherwise  near normal.  There is full active and passive range of motion intact at the ankle joint.  Achilles tendon intact negative Thompson test.  Calf soft nontender.    Right foot: Nontender, no swelling, ligament stable.  Neurovascular  exam grossly intact.      Imaging:    Xray Result (most recent):  XR TIBIA FIBULA RIGHT (2 VIEWS) 12/19/2022    Narrative  Right tibia/fibula AP and lateral views show the fracture site is barely visible, there is more ossification since last seen in the room and the overall alignment remains satisfactory and mostly neutral although there was slight change which remained about the same since the change to the position of the fracture on AP view.  There is no soft tissue swelling.           Please note that this dictation was completed with Nuance dictaphone, the computer voice recognition software. Quite often unanticipated grammatical, syntax, homophones, and other interpretive errors are inadvertently transcribed by the computer software. Please disregard these errors. Please excuse any errors that have escaped final proofreading.     An electronic signature was used to authenticate this note.  -- Richerd Kid, MD, Orthopedic Surgeon

## 2023-05-29 IMAGING — CR DG KNEE COMPLETE 4+V*R*
4 series · 4 of 4 positions shown · non-contrast
Comparison: None Available.

CLINICAL DATA: Pain. Encounter for pain, pt states his knee locked
up and cant move it, pt states had previous meniscus surgery

EXAM:
RIGHT KNEE - COMPLETE 4+ VIEW

[knee ap]
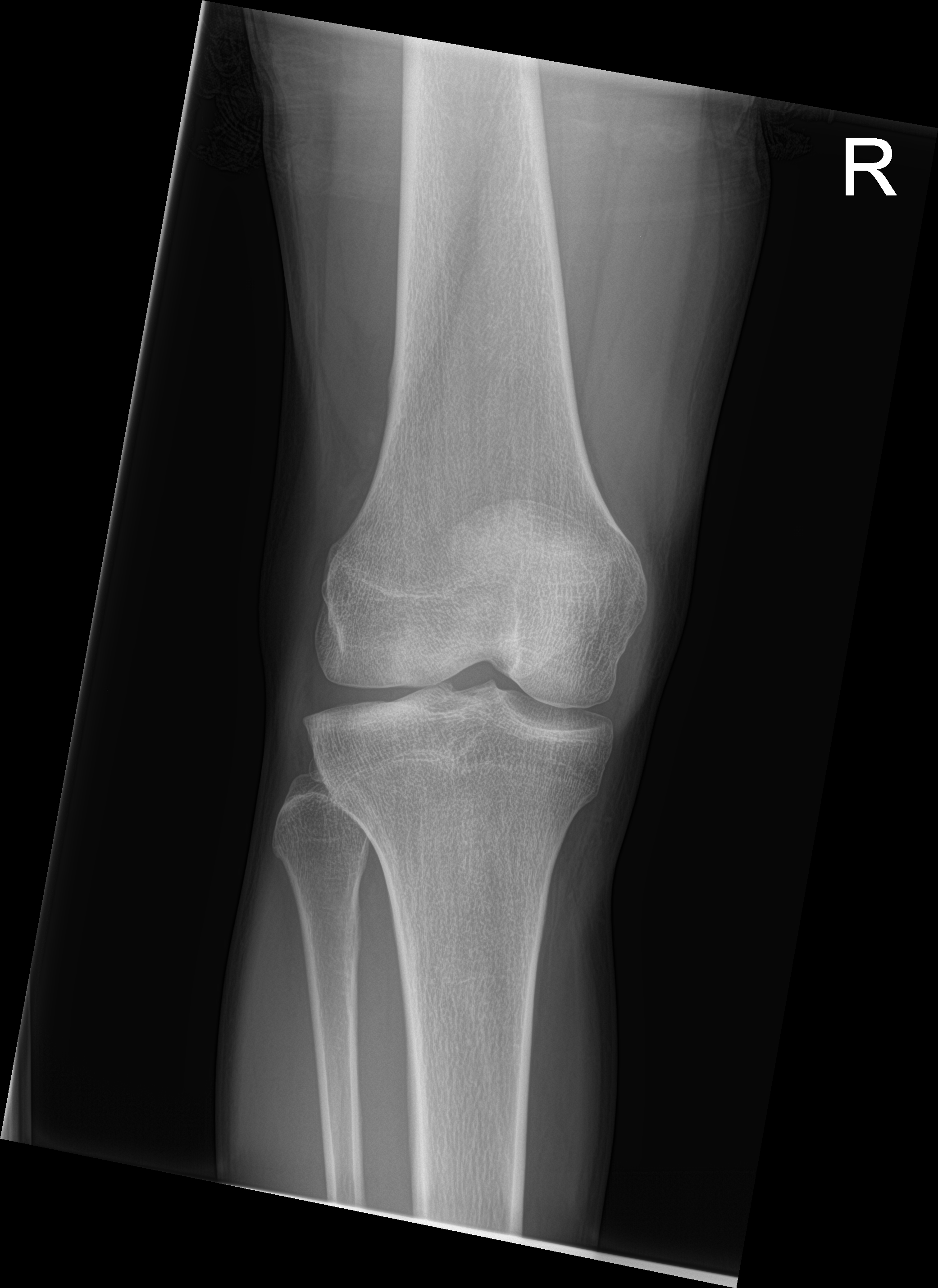

[knee lat]
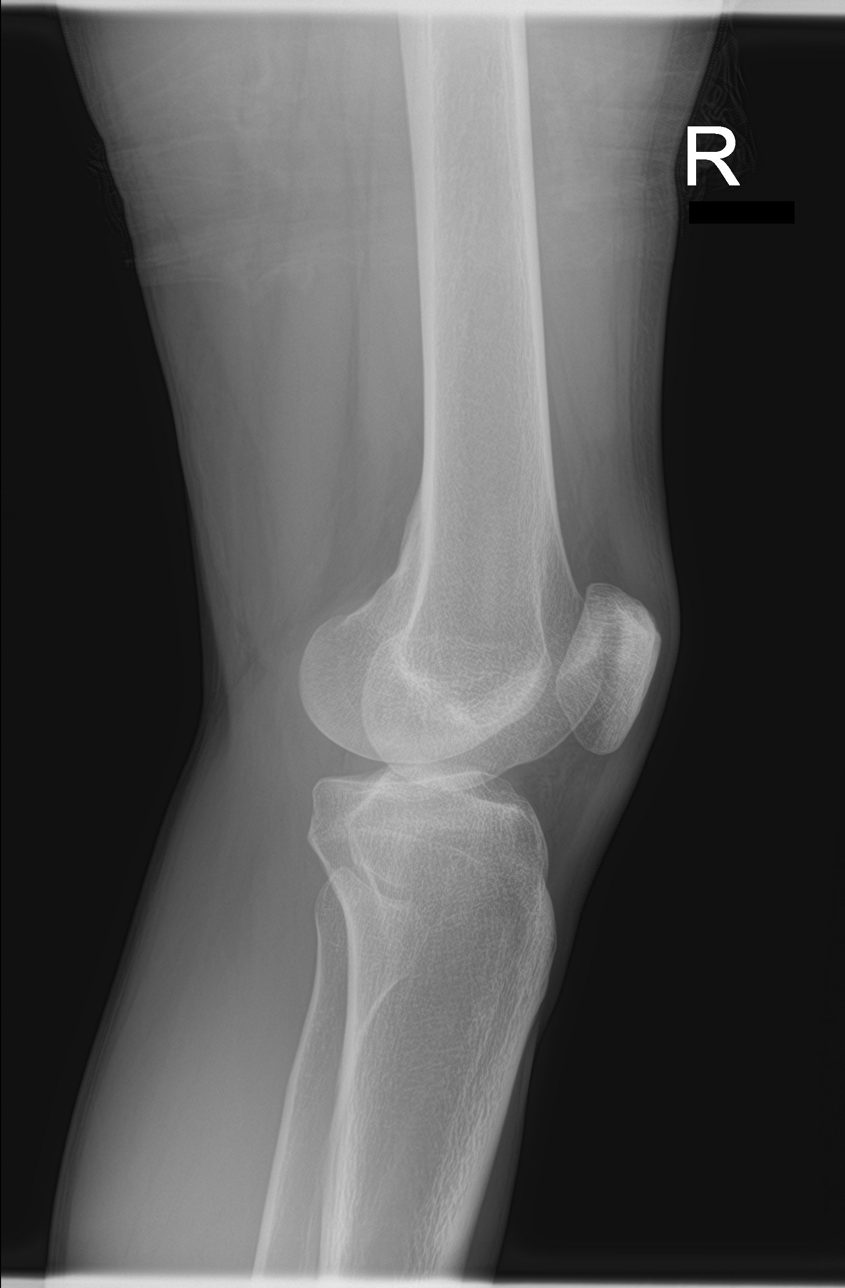

[knee obl (1 of 2)]
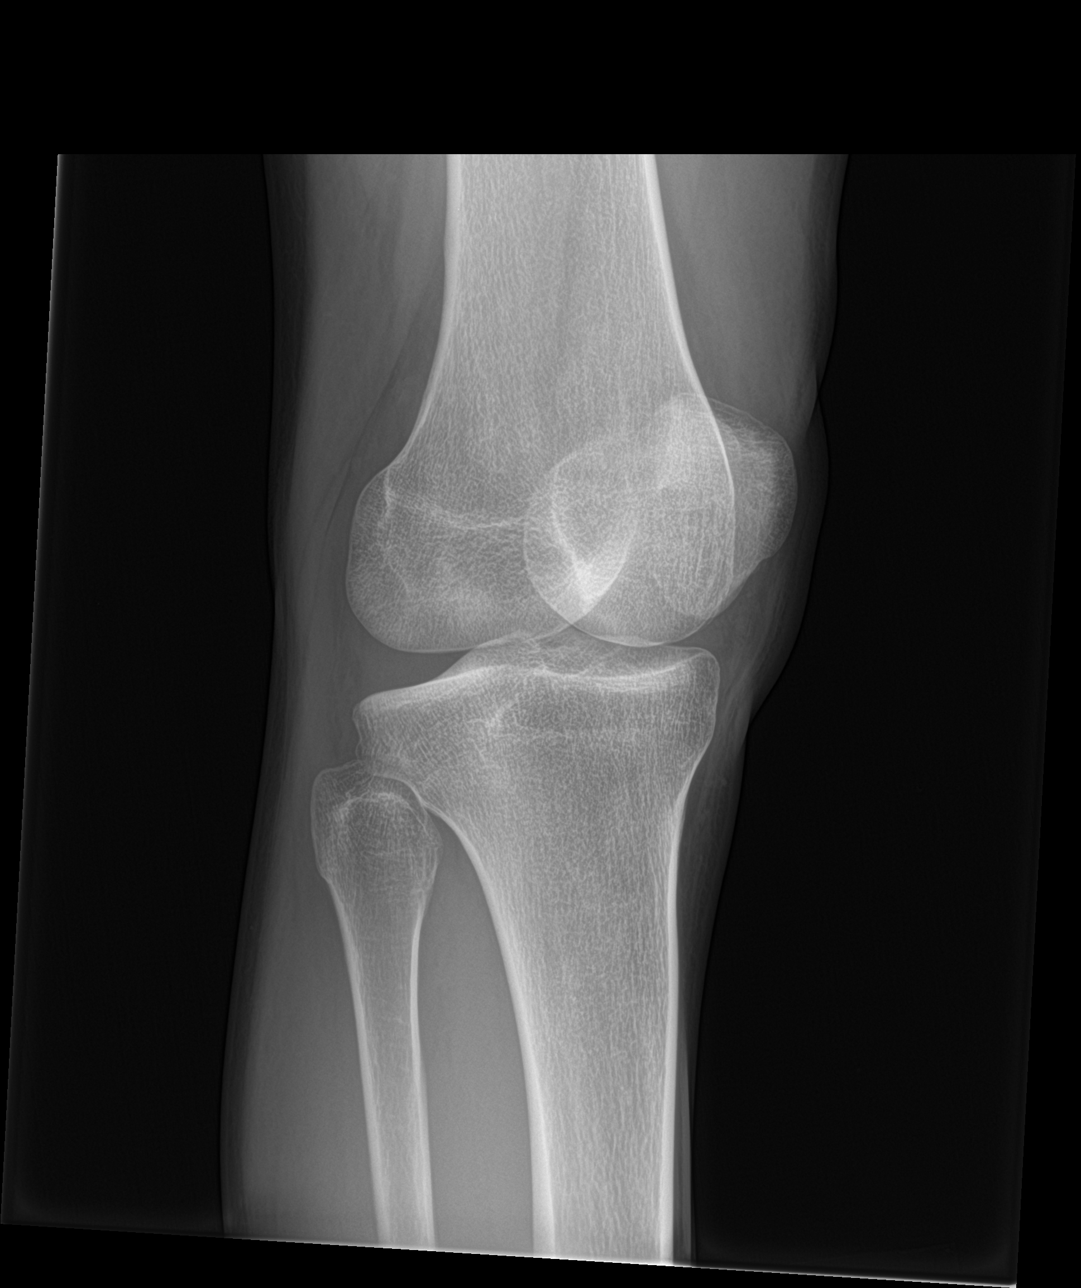

[knee obl (2 of 2)]
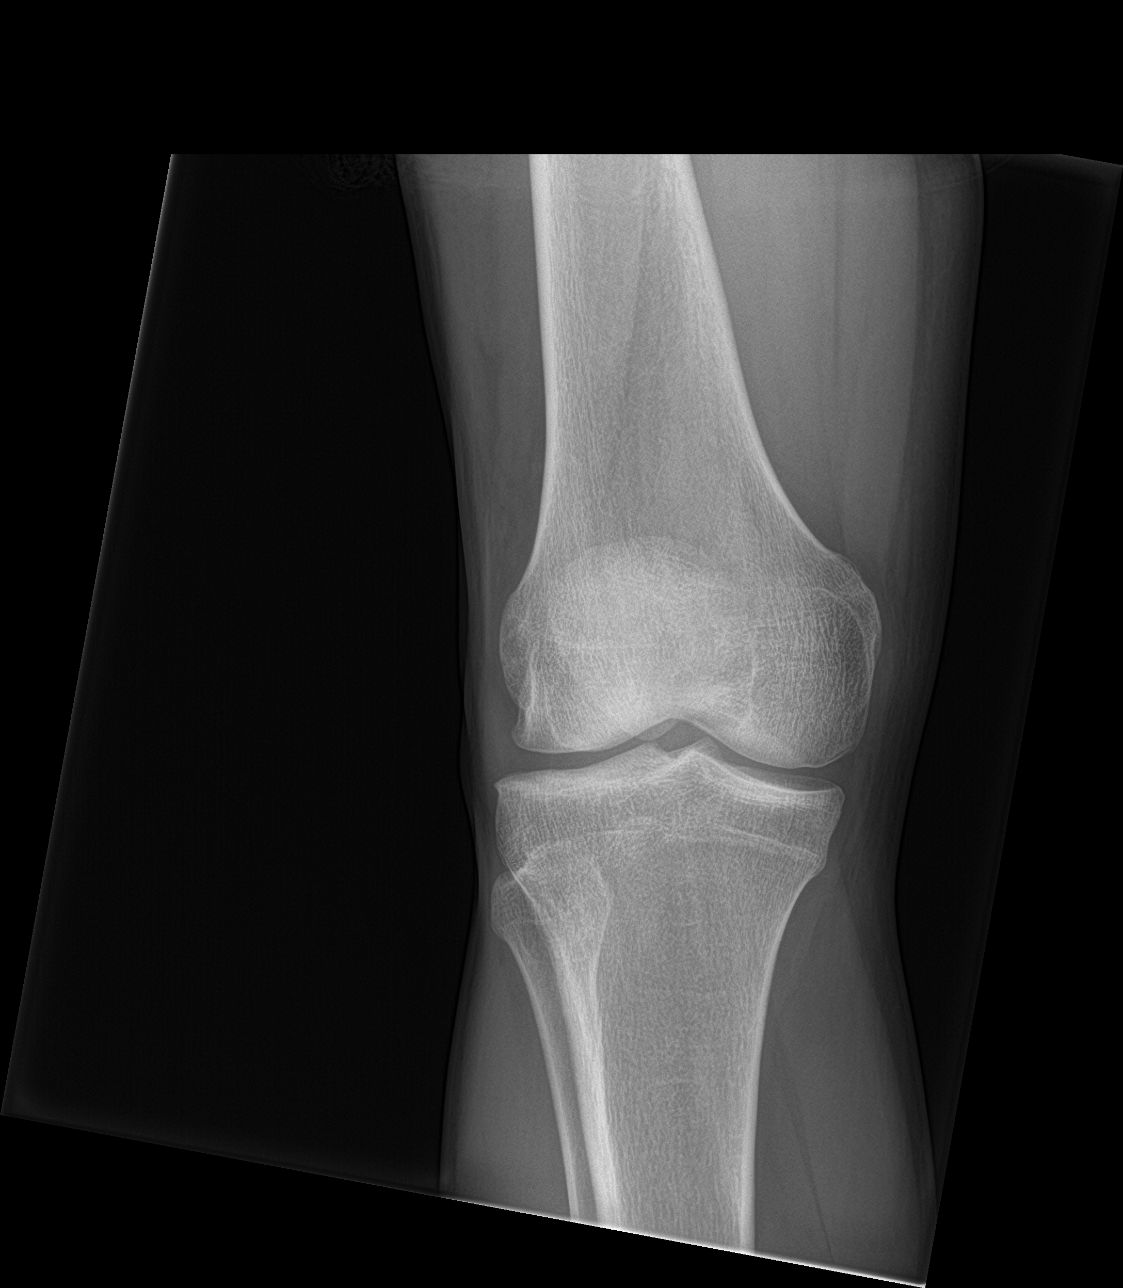

[4 of 4 positions shown; findings below may reference images not displayed]

FINDINGS: No evidence of fracture, dislocation, or joint effusion. No evidence
of arthropathy or other focal bone abnormality. Soft tissues are
unremarkable.
IMPRESSION: No acute displaced fracture or dislocation.
